# Patient Record
Sex: Female | Born: 1965
Health system: Southern US, Community
[De-identification: ages and names within clinical notes are randomized; demographics above are authoritative.]

## PROBLEM LIST (undated history)

## (undated) DIAGNOSIS — M1990 Unspecified osteoarthritis, unspecified site: Secondary | ICD-10-CM

## (undated) DIAGNOSIS — F419 Anxiety disorder, unspecified: Secondary | ICD-10-CM

## (undated) HISTORY — PX: TUBAL LIGATION: SHX77

## (undated) HISTORY — PX: OTHER SURGICAL HISTORY: SHX169

## (undated) HISTORY — DX: Unspecified osteoarthritis, unspecified site: M19.90

## (undated) HISTORY — DX: Anxiety disorder, unspecified: F41.9

## (undated) HISTORY — PX: BRAIN SURGERY: SHX531

---

## 1997-11-15 ENCOUNTER — Inpatient Hospital Stay (HOSPITAL_COMMUNITY): Admission: AD | Admit: 1997-11-15 | Discharge: 1997-11-16 | Payer: Self-pay | Admitting: Obstetrics and Gynecology

## 1997-11-19 ENCOUNTER — Inpatient Hospital Stay (HOSPITAL_COMMUNITY): Admission: AD | Admit: 1997-11-19 | Discharge: 1997-11-19 | Payer: Self-pay | Admitting: Obstetrics and Gynecology

## 1997-12-17 ENCOUNTER — Encounter (HOSPITAL_COMMUNITY): Admission: RE | Admit: 1997-12-17 | Discharge: 1998-03-17 | Payer: Self-pay | Admitting: Obstetrics and Gynecology

## 1997-12-21 ENCOUNTER — Other Ambulatory Visit: Admission: RE | Admit: 1997-12-21 | Discharge: 1997-12-21 | Payer: Self-pay | Admitting: Obstetrics and Gynecology

## 2015-12-17 ENCOUNTER — Ambulatory Visit (HOSPITAL_COMMUNITY)
Admission: EM | Admit: 2015-12-17 | Discharge: 2015-12-17 | Disposition: A | Discharge status: Home / Self Care | Payer: BLUE CROSS/BLUE SHIELD | Attending: Family Medicine | Admitting: Family Medicine

## 2015-12-17 ENCOUNTER — Encounter (HOSPITAL_COMMUNITY): Payer: Self-pay | Admitting: Emergency Medicine

## 2015-12-17 DIAGNOSIS — L241 Irritant contact dermatitis due to oils and greases: Secondary | ICD-10-CM

## 2015-12-17 MED ORDER — TRIAMCINOLONE ACETONIDE 40 MG/ML IJ SUSP
40.0000 mg | Freq: Once | INTRAMUSCULAR | Status: AC
  Administered 2015-12-17: 40 mg via INTRAMUSCULAR

## 2015-12-17 MED ORDER — PREDNISONE 50 MG PO TABS: ORAL_TABLET | ORAL | Status: DC

## 2015-12-17 MED ORDER — METHYLPREDNISOLONE ACETATE 40 MG/ML IJ SUSP
80.0000 mg | Freq: Once | INTRAMUSCULAR | Status: AC
  Administered 2015-12-17: 80 mg via INTRAMUSCULAR

## 2015-12-17 MED ORDER — HYDROXYZINE HCL 25 MG PO TABS: 25.0000 mg | ORAL_TABLET | Freq: Four times a day (QID) | ORAL | Status: DC

## 2015-12-17 MED ORDER — TRIAMCINOLONE ACETONIDE 40 MG/ML IJ SUSP
INTRAMUSCULAR | Status: AC
  Filled 2015-12-17: qty 1

## 2015-12-17 MED ORDER — METHYLPREDNISOLONE ACETATE 80 MG/ML IJ SUSP
INTRAMUSCULAR | Status: AC
  Filled 2015-12-17: qty 1

## 2015-12-17 NOTE — ED Notes (Signed)
Patient reports 4-5 days ago had a lot of itching, itching so badly it wakes her at night.  Patient denies any new soaps, detergents.

## 2015-12-17 NOTE — ED Provider Notes (Signed)
CSN: [id number]     Arrival date & time 12/17/15  1301 History   First MD Initiated Contact with Patient 12/17/15 1323     Chief Complaint  Patient presents with  . Rash   (Consider location/radiation/quality/duration/timing/severity/associated sxs/prior Treatment) Patient is a 50 y.o. female presenting with rash. The history is provided by the patient.  Rash Location:  Full body Quality: dryness and itchiness   Severity:  Moderate Onset quality:  Gradual Duration:  4 days Progression:  Unchanged Chronicity:  New Context comment:  Onset after going to tanning bed. Relieved by:  None tried Associated symptoms: fever   Associated symptoms: no diarrhea, no sore throat and not wheezing     History reviewed. No pertinent past medical history. History reviewed. No pertinent past surgical history. No family history on file. Social History  Substance Use Topics  . Smoking status: Never Smoker   . Smokeless tobacco: None  . Alcohol Use: No   OB History    No data available     Review of Systems  Constitutional: Positive for fever.  HENT: Negative.  Negative for sore throat.   Respiratory: Negative.  Negative for wheezing.   Cardiovascular: Negative.   Gastrointestinal: Negative for diarrhea.  Skin: Positive for rash. Negative for color change and pallor.  All other systems reviewed and are negative.   Allergies  Review of patient's allergies indicates no known allergies.  Home Medications   Prior to Admission medications   Medication Sig Start Date End Date Taking? Authorizing Provider  citalopram (CELEXA) 10 MG tablet Take 10 mg by mouth daily.   Yes Historical Provider, MD  hydrOXYzine (ATARAX/VISTARIL) 25 MG tablet Take 1 tablet (25 mg total) by mouth every 6 (six) hours. Prn itching 12/17/15   Billy Fischer, MD  predniSONE (DELTASONE) 50 MG tablet 1 tab daily for 2 days then 1/2 tab daily for 2 days. Start on sat, take until finished. 12/17/15   Billy Fischer, MD   Meds  Ordered and Administered this Visit   Medications  methylPREDNISolone acetate (DEPO-MEDROL) injection 80 mg (80 mg Intramuscular Given 12/17/15 1345)  triamcinolone acetonide (KENALOG-40) injection 40 mg (40 mg Intramuscular Given 12/17/15 1345)    BP 137/82 mmHg  Pulse 76  Temp(Src) 97.9 F (36.6 C) (Oral)  Resp 16  SpO2 100%  LMP 12/07/2015 No data found.   Physical Exam  Constitutional: She is oriented to person, place, and time. She appears well-developed and well-nourished. No distress.  Neck: Normal range of motion. Neck supple.  Cardiovascular: Normal rate.   Pulmonary/Chest: Effort normal and breath sounds normal. No respiratory distress.  Neurological: She is alert and oriented to person, place, and time.  Skin: Skin is warm and dry. Rash noted.  Excoriated ecchymosis from pruritis of skin, no distinct lesions.  Nursing note and vitals reviewed.   ED Course  Procedures (including critical care time)  Labs Review Labs Reviewed - No data to display  Imaging Review No results found.   Visual Acuity Review  Right Eye Distance:   Left Eye Distance:   Bilateral Distance:    Right Eye Near:   Left Eye Near:    Bilateral Near:         MDM   1. Contact dermatitis due to oils        Billy Fischer, MD 12/21/15 2051

## 2016-01-24 DIAGNOSIS — R229 Localized swelling, mass and lump, unspecified: Secondary | ICD-10-CM | POA: Insufficient documentation

## 2016-01-24 DIAGNOSIS — H9311 Tinnitus, right ear: Secondary | ICD-10-CM | POA: Insufficient documentation

## 2016-05-24 ENCOUNTER — Other Ambulatory Visit: Payer: Self-pay | Admitting: Occupational Medicine

## 2016-05-24 ENCOUNTER — Ambulatory Visit (HOSPITAL_COMMUNITY)
Admission: RE | Admit: 2016-05-24 | Discharge: 2016-05-24 | Disposition: A | Discharge status: Home / Self Care | Payer: BLUE CROSS/BLUE SHIELD | Source: Ambulatory Visit | Attending: Occupational Medicine | Admitting: Occupational Medicine

## 2016-05-24 DIAGNOSIS — R7612 Nonspecific reaction to cell mediated immunity measurement of gamma interferon antigen response without active tuberculosis: Secondary | ICD-10-CM

## 2017-02-23 ENCOUNTER — Encounter: Payer: Self-pay | Admitting: Family Medicine

## 2017-02-23 ENCOUNTER — Ambulatory Visit (INDEPENDENT_AMBULATORY_CARE_PROVIDER_SITE_OTHER): Payer: BLUE CROSS/BLUE SHIELD

## 2017-02-23 ENCOUNTER — Ambulatory Visit (INDEPENDENT_AMBULATORY_CARE_PROVIDER_SITE_OTHER): Payer: BLUE CROSS/BLUE SHIELD | Admitting: Family Medicine

## 2017-02-23 VITALS — BP 102/70 | HR 78 | Temp 98.1°F | Ht 63.0 in | Wt 133.2 lb

## 2017-02-23 DIAGNOSIS — G43109 Migraine with aura, not intractable, without status migrainosus: Secondary | ICD-10-CM | POA: Diagnosis not present

## 2017-02-23 DIAGNOSIS — M25551 Pain in right hip: Secondary | ICD-10-CM | POA: Diagnosis not present

## 2017-02-23 DIAGNOSIS — N939 Abnormal uterine and vaginal bleeding, unspecified: Secondary | ICD-10-CM | POA: Diagnosis not present

## 2017-02-23 LAB — CBC
HCT: 39.9 % (ref 36.0–46.0)
HEMOGLOBIN: 13.3 g/dL (ref 12.0–15.0)
MCHC: 33.2 g/dL (ref 30.0–36.0)
MCV: 85.6 fl (ref 78.0–100.0)
Platelets: 345 10*3/uL (ref 150.0–400.0)
RBC: 4.66 Mil/uL (ref 3.87–5.11)
RDW: 13.7 % (ref 11.5–15.5)
WBC: 6.4 10*3/uL (ref 4.0–10.5)

## 2017-02-23 LAB — POCT URINE PREGNANCY: Preg Test, Ur: NEGATIVE

## 2017-02-23 LAB — TSH: TSH: 1.11 u[IU]/mL (ref 0.35–4.50)

## 2017-02-23 NOTE — Assessment & Plan Note (Signed)
Patient with over a year of abnormal uterine bleeding. She has seen a gynecologist on several occasions for this and she reports negative workup. We'll request records from them. We'll check lab work as outlined below. Urine pregnancy test was negative. Given return precautions.

## 2017-02-23 NOTE — Assessment & Plan Note (Signed)
Slight discomfort on internal range of motion right hip. We'll obtain an x-ray. Consider referral to sports medicine or orthopedics

## 2017-02-23 NOTE — Progress Notes (Signed)
Tommi Rumps, MD Phone: [phone]  Margaret Blake is a 51 y.o. female who presents today for new patient visit.  Patient notes at least over the last year she's had menstrual cycles monthly and then for about 3 months at a time she'll have another menstrual cycle a couple weeks later. She'll then go about 3 months with normal menstrual cycles. Notes her cycles are heavy and last for 6-7 days. She is a [id number]. She is status post tubal ligation. She notes no vision changes. She has seen gynecology twice in the last year for this. She reports they did an ultrasound and an exam as well as blood work and found no cause. They discussed doing an IUD though she declined this. She wanted an ablation. She has seen them this year. She is not on her menstrual cycle at this time.  She notes right hip pain that has been somewhat chronic over a number of months. Started after a dialysis machine fell on her at work and then she fell again at home while moving. Notes typically it occurs in her right hip and feels as though it is in the joint. She notes no overlying skin changes.  She reports a history of migraines with aura. Since she had her daughter about 19 years ago she has not had the pain with them though has had the aura and gets lots of spots in her vision. She reports if she does not take something for this it will be difficult for her to see. She notes the vision issues resolved within 20 minutes of taking ibuprofen. She has followed with an eye doctor on a consistent basis. She's never seen neurology. She has had no numbness or weakness. She does note some right hand tremor that other people have noticed that resolves when she looks at her right hand. Her gynecologist tried to order CT scan to evaluate all of this though her insurance did not cover it.  Active Ambulatory Problems    Diagnosis Date Noted  . Abnormal uterine bleeding 02/23/2017  . Right hip pain 02/23/2017  . Migraine with aura and  without status migrainosus, not intractable 02/23/2017   Resolved Ambulatory Problems    Diagnosis Date Noted  . No Resolved Ambulatory Problems   Past Medical History:  Diagnosis Date  . Anxiety     Family History  Problem Relation Age of Onset  . Diabetes Father     Social History   Social History  . Marital status: Married    Spouse name: N/A  . Number of children: N/A  . Years of education: N/A   Occupational History  . Not on file.   Social History Main Topics  . Smoking status: Former Research scientist (life sciences)  . Smokeless tobacco: Never Used  . Alcohol use Yes     Comment: socially  . Drug use: No  . Sexual activity: Not on file   Other Topics Concern  . Not on file   Social History Narrative  . No narrative on file    ROS  General:  Negative for nexplained weight loss, fever Skin: Negative for new or changing mole, sore that won't heal HEENT: Negative for trouble hearing, trouble seeing, ringing in ears, mouth sores, hoarseness, change in voice, dysphagia. CV:  Negative for chest pain, dyspnea, edema, palpitations Resp: Negative for cough, dyspnea, hemoptysis GI: Negative for nausea, vomiting, diarrhea, constipation, abdominal pain, melena, hematochezia. GU: Negative for dysuria, incontinence, urinary hesitance, hematuria, vaginal or penile discharge, polyuria, sexual difficulty,  lumps in testicle or breasts MSK: Negative for muscle cramps or aches, joint pain or swelling Neuro: Negative for headaches, weakness, numbness, dizziness, passing out/fainting Psych: Negative for depression, anxiety, memory problems  Objective  Physical Exam Vitals:   02/23/17 1044  BP: 102/70  Pulse: 78  Temp: 98.1 F (36.7 C)  SpO2: 98%    BP Readings from Last 3 Encounters:  02/23/17 102/70  12/17/15 137/82   Wt Readings from Last 3 Encounters:  02/23/17 133 lb 3.2 oz (60.4 kg)    Physical Exam  Constitutional: No distress.  HENT:  Head: Normocephalic and atraumatic.    Mouth/Throat: Oropharynx is clear and moist. No oropharyngeal exudate.  Eyes: Pupils are equal, round, and reactive to light. Conjunctivae are normal.  Cardiovascular: Normal rate, regular rhythm and normal heart sounds.   Pulmonary/Chest: Effort normal and breath sounds normal.  Abdominal: Soft. Bowel sounds are normal. She exhibits no distension. There is no tenderness. There is no rebound and no guarding.  Musculoskeletal:  Full range of motion bilateral hips with slight discomfort on passive internal rotation of the right hip, no tenderness of anterior or lateral hips bilaterally  Neurological: She is alert.  CN 2-12 intact, 5/5 strength in bilateral biceps, triceps, grip, quads, hamstrings, plantar and dorsiflexion, sensation to light touch intact in bilateral UE and LE, normal gait  Skin: Skin is warm and dry. She is not diaphoretic.  Psychiatric: Mood and affect normal.     Assessment/Plan:   Migraine with aura and without status migrainosus, not intractable Patient with history of chronic intermittent migraines. More recently sounds like she has been having ocular migraines. Neurologically intact today. She does report some tremor. Given tremor and ocular migraine we will have her see neurology for evaluation. She is given return precautions.  Abnormal uterine bleeding Patient with over a year of abnormal uterine bleeding. She has seen a gynecologist on several occasions for this and she reports negative workup. We'll request records from them. We'll check lab work as outlined below. Urine pregnancy test was negative. Given return precautions.  Right hip pain Slight discomfort on internal range of motion right hip. We'll obtain an x-ray. Consider referral to sports medicine or orthopedics   Orders Placed This Encounter  Procedures  . DG HIP UNILAT WITH PELVIS 2-3 VIEWS RIGHT    Standing Status:   Future    Number of Occurrences:   1    Standing Expiration Date:   04/25/2018     Order Specific Question:   Reason for Exam (SYMPTOM  OR DIAGNOSIS REQUIRED)    Answer:   right hip pain chronically    Order Specific Question:   Is patient pregnant?    Answer:   No    Order Specific Question:   Preferred imaging location?    Answer:   [facility] Specific Question:   Radiology Contrast Protocol - do NOT remove file path    Answer:   [email or web address]  . Prolactin  . TSH  . CBC  . Ambulatory referral to Neurology    Referral Priority:   Routine    Referral Type:   Consultation    Referral Reason:   Specialty Services Required    Requested Specialty:   Neurology    Number of Visits Requested:   1  . POCT urine pregnancy    No orders of the defined types were placed in this encounter.    Tommi Rumps, MD [facility]  [facility] [employer]

## 2017-02-23 NOTE — Patient Instructions (Signed)
Nice to see you. We'll request records from your gynecologist. Margaret Blake get an x-ray of your right hip today and call you with the results. We'll get you to see neurology. We'll get lab work and call with results as well. If you develop numbness, vision changes, weakness, excessive vaginal bleeding, or any new or changing symptoms please seek medical attention.

## 2017-02-23 NOTE — Assessment & Plan Note (Signed)
Patient with history of chronic intermittent migraines. More recently sounds like she has been having ocular migraines. Neurologically intact today. She does report some tremor. Given tremor and ocular migraine we will have her see neurology for evaluation. She is given return precautions.

## 2017-02-24 LAB — PROLACTIN: PROLACTIN: 7.8 ng/mL

## 2017-02-25 ENCOUNTER — Other Ambulatory Visit: Payer: Self-pay | Admitting: Family Medicine

## 2017-02-25 DIAGNOSIS — N939 Abnormal uterine and vaginal bleeding, unspecified: Secondary | ICD-10-CM

## 2017-02-27 ENCOUNTER — Other Ambulatory Visit: Payer: Self-pay | Admitting: *Deleted

## 2017-02-27 NOTE — Telephone Encounter (Signed)
Last OV 02/23/17 filed under histoircal

## 2017-02-27 NOTE — Telephone Encounter (Signed)
Pt has requested a medication refill for citalopram  Pharmacy [facility] [address]

## 2017-02-28 NOTE — Telephone Encounter (Signed)
Please confirm dosage and reason for taking this medication. We did not discuss this medication at her visit so I want to make sure of the dosage prior to prescribing.

## 2017-03-01 NOTE — Telephone Encounter (Signed)
Patient states 20 mg daily and takes this for anxiety [facility] [address]

## 2017-03-02 MED ORDER — CITALOPRAM HYDROBROMIDE 20 MG PO TABS: 20.0000 mg | ORAL_TABLET | Freq: Every day | ORAL | 5 refills | Status: DC

## 2017-03-06 ENCOUNTER — Ambulatory Visit (INDEPENDENT_AMBULATORY_CARE_PROVIDER_SITE_OTHER): Payer: BLUE CROSS/BLUE SHIELD | Admitting: Obstetrics and Gynecology

## 2017-03-06 ENCOUNTER — Encounter: Payer: Self-pay | Admitting: Obstetrics and Gynecology

## 2017-03-06 VITALS — BP 124/78 | Ht 63.0 in | Wt 134.0 lb

## 2017-03-06 DIAGNOSIS — N939 Abnormal uterine and vaginal bleeding, unspecified: Secondary | ICD-10-CM | POA: Diagnosis not present

## 2017-03-06 NOTE — Progress Notes (Signed)
Chief Complaint  Patient presents with  . Menstrual Problem    HPI:      Ms. Margaret Blake is a 51 y.o. [id number] who LMP was Patient's last menstrual period was 02/23/2017., presents today for NP menstrual problem, referred by PCP Caryl Bis, Betsie Adam, MD). She has a hx of DUB sx for the past 2-3 yrs. Menses are usually monthly, lasting 6-7 days with med flow, large clots. Pt has occas dysmen and treats with ibup with sx relief. She occas has midcycle bleeding that lasts 5-7 days, lighter flow than a period, no clots or dysmen. Pt feels like sx are triggered by stress. She saw GYN MD in [place] last yr and had a neg u/s and neg labs, neg pap. She was told about IUD and ablation, but never proceeded. She is not interested in IUD but wants ablation. Her PCP rechecked labs, including thyroid, 8/18 and they were neg. He referred pt here for mgmt.  She is sex active, no dyspareunia, postcoital bleeding.  She is current on pap smear/annual.    Past Medical History:  Diagnosis Date  . Anxiety     Past Surgical History:  Procedure Laterality Date  . TUBAL LIGATION      Family History  Problem Relation Age of Onset  . Diabetes Father     Social History   Social History  . Marital status: Married    Spouse name: N/A  . Number of children: N/A  . Years of education: N/A   Occupational History  . Not on file.   Social History Main Topics  . Smoking status: Former Research scientist (life sciences)  . Smokeless tobacco: Never Used  . Alcohol use Yes     Comment: socially  . Drug use: No  . Sexual activity: Yes    Birth control/ protection: None   Other Topics Concern  . Not on file   Social History Narrative  . No narrative on file     Current Outpatient Prescriptions:  .  citalopram (CELEXA) 20 MG tablet, Take 1 tablet (20 mg total) by mouth daily., Disp: 30 tablet, Rfl: 5   ROS:  Review of Systems  Constitutional: Negative for fever.  Gastrointestinal: Negative for blood in stool,  constipation, diarrhea, nausea and vomiting.  Genitourinary: Positive for menstrual problem. Negative for dyspareunia, dysuria, flank pain, frequency, hematuria, urgency, vaginal bleeding, vaginal discharge and vaginal pain.  Musculoskeletal: Negative for back pain.  Skin: Negative for rash.     OBJECTIVE:   Vitals:  BP 124/78   Ht 5\' 3"  (1.6 m)   Wt 134 lb (60.8 kg)   LMP 02/23/2017   BMI 23.74 kg/m   Physical Exam  Constitutional: She is oriented to person, place, and time and well-developed, well-nourished, and in no distress. Vital signs are normal.  Genitourinary: Vagina normal, uterus normal, cervix normal, right adnexa normal, left adnexa normal and vulva normal. Uterus is not enlarged. Cervix exhibits no motion tenderness and no tenderness. Right adnexum displays no mass and no tenderness. Left adnexum displays no mass and no tenderness. Vulva exhibits no erythema, no exudate, no lesion, no rash and no tenderness. Vagina exhibits no lesion.  Neurological: She is alert and oriented to person, place, and time.  Psychiatric: Memory, affect and judgment normal.  Vitals reviewed.   Assessment/Plan: Abnormal uterine bleeding (AUB) - For 2-3 yrs, episodic mid-cycle bleeding. Discussed options of doing nothing, IUD, prog only OCPs, ablation, if u/s still neg (had neg labs 8/18).  - Plan:  US Transvaginal Non-OB Will call pt with u/s results and discuss options at that time. Discussed IUD and ablation procedures. Pt wants IUD now, if a candidate after u/s. Mirena handout given. If fails IUD, then will consider ablation.     Return in about 2 days (around 03/08/2017) for GYN u/s for DUB--[facility] to call pt.  Yosgar Demirjian B. Kanoa Phillippi, PA-C 03/06/2017 10:51 AM

## 2017-03-08 ENCOUNTER — Ambulatory Visit (INDEPENDENT_AMBULATORY_CARE_PROVIDER_SITE_OTHER): Payer: BLUE CROSS/BLUE SHIELD

## 2017-03-08 DIAGNOSIS — N939 Abnormal uterine and vaginal bleeding, unspecified: Secondary | ICD-10-CM | POA: Diagnosis not present

## 2017-03-13 ENCOUNTER — Telehealth: Payer: Self-pay | Admitting: Obstetrics and Gynecology

## 2017-03-13 NOTE — Telephone Encounter (Signed)
Pt aware of neg u/s. EM=6.98mm. Pt interested in IUD for DUB sx. Will do EMB with IUD insertion. Pt to call with next menses for insertion.

## 2017-04-19 ENCOUNTER — Ambulatory Visit (INDEPENDENT_AMBULATORY_CARE_PROVIDER_SITE_OTHER): Payer: BLUE CROSS/BLUE SHIELD | Admitting: Neurology

## 2017-04-19 ENCOUNTER — Encounter: Payer: Self-pay | Admitting: Neurology

## 2017-04-19 ENCOUNTER — Encounter (INDEPENDENT_AMBULATORY_CARE_PROVIDER_SITE_OTHER): Payer: Self-pay

## 2017-04-19 VITALS — BP 113/73 | HR 89 | Ht 63.0 in | Wt 132.0 lb

## 2017-04-19 DIAGNOSIS — G43109 Migraine with aura, not intractable, without status migrainosus: Secondary | ICD-10-CM | POA: Diagnosis not present

## 2017-04-19 DIAGNOSIS — R251 Tremor, unspecified: Secondary | ICD-10-CM | POA: Insufficient documentation

## 2017-04-19 HISTORY — DX: Tremor, unspecified: R25.1

## 2017-04-19 NOTE — Progress Notes (Signed)
PATIENT: Margaret Blake DOB: January 05, 1966  Chief Complaint  Patient presents with  . New Patient (Initial Visit)    migraines- fluctuate in frequency, maybe 1-2 in a month, depends on stress level  . Tremors    pt does not notice tremors, but friends and family notice tremor in right hand  . PCP/Referring    Dr. Caryl Bis. vision: 20/30 bilaterally with correction     HISTORICAL  Margaret Blake is a 51 year old female, seen in refer by her primary care doctor  Tommi Rumps for evaluation of right hand tremor, migraine headaches, initial evaluation was April 19 2017.  She had a history of migraine headaches since young, used to have typical migraines, lateralized severe headache with associated light noise sensitivity, for a while her headache almost disappeared, but since 2015, she began to have aura without migraine, spots light in her visual field, spreading to become visual field deficit, lasting 20 minutes after taking ibuprofen, it does not has associated headaches.  She also noticed intermittent right hand tremor since 2016, noticed it when she holding out her arm, or hold still, she denies left hand involvement.  She denied loss sense of smell, constant right ear ringing sound, no hearing loss, no visual loss.  REVIEW OF SYSTEMS: Full 14 system review of systems performed and notable only for Fatigue, ringing ears, loss of vision, memory loss, tremor  ALLERGIES: No Known Allergies  HOME MEDICATIONS: Current Outpatient Prescriptions  Medication Sig Dispense Refill  . citalopram (CELEXA) 20 MG tablet Take 1 tablet (20 mg total) by mouth daily. 30 tablet 5   No current facility-administered medications for this visit.     PAST MEDICAL HISTORY: Past Medical History:  Diagnosis Date  . Anxiety     PAST SURGICAL HISTORY: Past Surgical History:  Procedure Laterality Date  . TUBAL LIGATION      FAMILY HISTORY: Family History  Problem Relation Age of  Onset  . Diabetes Father     SOCIAL HISTORY:  Social History   Social History  . Marital status: Married    Spouse name: N/A  . Number of children: N/A  . Years of education: N/A   Occupational History  . Not on file.   Social History Main Topics  . Smoking status: Former Research scientist (life sciences)  . Smokeless tobacco: Never Used  . Alcohol use Yes     Comment: socially  . Drug use: No  . Sexual activity: Yes    Birth control/ protection: None   Other Topics Concern  . Not on file   Social History Narrative  . No narrative on file     PHYSICAL EXAM   Vitals:   04/19/17 0817  BP: 113/73  Pulse: 89  Weight: 132 lb (59.9 kg)  Height: 5\' 3"  (1.6 m)    Not recorded      Body mass index is 23.38 kg/m.  PHYSICAL EXAMNIATION:  Gen: NAD, conversant, well nourised, obese, well groomed                     Cardiovascular: Regular rate rhythm, no peripheral edema, warm, nontender. Eyes: Conjunctivae clear without exudates or hemorrhage Neck: Supple, no carotid bruits. Pulmonary: Clear to auscultation bilaterally   NEUROLOGICAL EXAM:  MENTAL STATUS: Speech:    Speech is normal; fluent and spontaneous with normal comprehension.  Cognition:     Orientation to time, place and person     Normal recent and remote memory  Normal Attention span and concentration     Normal Language, naming, repeating,spontaneous speech     Fund of knowledge   CRANIAL NERVES: CN II: Visual fields are full to confrontation. Fundoscopic exam is normal with sharp discs and no vascular changes. Pupils are round equal and briskly reactive to light. CN III, IV, VI: extraocular movement are normal. No ptosis. CN V: Facial sensation is intact to pinprick in all 3 divisions bilaterally. Corneal responses are intact.  CN VII: Face is symmetric with normal eye closure and smile. CN VIII: Hearing is normal to rubbing fingers CN IX, X: Palate elevates symmetrically. Phonation is normal. CN XI: Head turning  and shoulder shrug are intact CN XII: Tongue is midline with normal movements and no atrophy.  MOTOR: There is no pronator drift of out-stretched arms. Muscle bulk and tone are normal. Muscle strength is normal.  REFLEXES: Reflexes are 2+ and symmetric at the biceps, triceps, knees, and ankles. Plantar responses are flexor.  SENSORY: Intact to light touch, pinprick, positional sensation and vibratory sensation are intact in fingers and toes.  COORDINATION: Rapid alternating movements and fine finger movements are intact. There is no dysmetria on finger-to-nose and heel-knee-shin.    GAIT/STANCE: Posture is normal. Gait is steady with normal steps, base, arm swing, and turning. Heel and toe walking are normal. Tandem gait is normal.  Romberg is absent.   DIAGNOSTIC DATA (LABS, IMAGING, TESTING) - I reviewed patient records, labs, notes, testing and imaging myself where available.   ASSESSMENT AND PLAN  Margaret Blake is a 51 y.o. female   Migraine aura Right hand tremor  Normal TSH, CBC,  Continue NSAIDs as needed  Differentiation for her tremor include asymmetric essential tremor, exaggerated physiological tremor, I did not see any signs of parkinsonian on today's exam  Follow-up with our clinic as needed   Marcial Pacas, M.D. Ph.D.  [facility] [address] [place], [place] [address] Ph: [phone] Fax: [phone]  CC: Leone Haven, MD

## 2017-05-12 ENCOUNTER — Telehealth: Payer: Self-pay | Admitting: Family Medicine

## 2017-05-12 DIAGNOSIS — N6001 Solitary cyst of right breast: Secondary | ICD-10-CM

## 2017-05-12 DIAGNOSIS — N6002 Solitary cyst of left breast: Principal | ICD-10-CM

## 2017-05-12 NOTE — Telephone Encounter (Signed)
Reviewed records from prior gynecologist. It appears that they recommended follow-up bilateral breast ultrasound on her most recent mammogram. This will be scanned into the chart. It does not appear this was completed based on the records we received. Please see if she had this done. Thanks.

## 2017-05-14 NOTE — Telephone Encounter (Signed)
Order has been placed.

## 2017-05-14 NOTE — Telephone Encounter (Signed)
Patient states she did not have this done, patient states she would like for [place] to set this up if possible

## 2017-05-14 NOTE — Addendum Note (Signed)
Addended by: Leone Haven on: 05/14/2017 09:57 AM   Modules accepted: Orders

## 2017-05-17 ENCOUNTER — Other Ambulatory Visit: Payer: Self-pay | Admitting: Family Medicine

## 2017-05-17 DIAGNOSIS — R928 Other abnormal and inconclusive findings on diagnostic imaging of breast: Secondary | ICD-10-CM

## 2017-05-22 ENCOUNTER — Telehealth: Payer: Self-pay | Admitting: Family Medicine

## 2017-05-22 NOTE — Telephone Encounter (Signed)
Pt would like to wait for mammo until next year per pt.

## 2017-05-28 ENCOUNTER — Other Ambulatory Visit: Payer: Self-pay

## 2017-05-28 MED ORDER — CITALOPRAM HYDROBROMIDE 20 MG PO TABS: 20.0000 mg | ORAL_TABLET | Freq: Every day | ORAL | 0 refills | Status: DC

## 2017-06-14 ENCOUNTER — Ambulatory Visit: Payer: BLUE CROSS/BLUE SHIELD | Admitting: Family Medicine

## 2017-08-23 ENCOUNTER — Ambulatory Visit: Payer: BLUE CROSS/BLUE SHIELD | Admitting: Family Medicine

## 2017-08-24 ENCOUNTER — Other Ambulatory Visit: Payer: Self-pay | Admitting: Family Medicine

## 2017-08-28 NOTE — Telephone Encounter (Signed)
Sent to pharmacy.  She needs to follow-up for consideration of further refills.  Thanks.

## 2017-08-28 NOTE — Telephone Encounter (Signed)
Okay to refill Celexa?  Pt last seen in August 2018 and cancelled her last 2 appt including 08/23/17 appt.

## 2017-08-29 ENCOUNTER — Encounter: Payer: Self-pay | Admitting: *Deleted

## 2017-08-29 NOTE — Telephone Encounter (Signed)
Mailed letter °

## 2017-10-18 ENCOUNTER — Other Ambulatory Visit: Payer: Self-pay | Admitting: Family Medicine

## 2018-01-26 ENCOUNTER — Other Ambulatory Visit: Payer: Self-pay | Admitting: Family Medicine

## 2018-04-16 ENCOUNTER — Ambulatory Visit: Payer: BLUE CROSS/BLUE SHIELD | Admitting: Family Medicine

## 2018-04-16 DIAGNOSIS — Z0289 Encounter for other administrative examinations: Secondary | ICD-10-CM

## 2018-07-25 ENCOUNTER — Telehealth: Payer: Self-pay | Admitting: Family Medicine

## 2018-07-25 DIAGNOSIS — R928 Other abnormal and inconclusive findings on diagnostic imaging of breast: Secondary | ICD-10-CM

## 2018-07-25 NOTE — Telephone Encounter (Signed)
Called and spoke with patient. Pt advised about MM. Pt stated that she is having hot flashes and memory loss, and has been without a period for 7 months. I did advised pt that she has to not have a period for a full year until she is consisted to be in menopause.

## 2018-07-25 NOTE — Telephone Encounter (Signed)
Noted. I will place orders. Please find out why she thinks she has hit menopause.

## 2018-07-25 NOTE — Telephone Encounter (Signed)
Called and spoke with patient. Pt stated that she does plan to follow up with [facility]. She would like the orders placed for MM. Pt has scheduled for an OV with you in March. 10/01/2018. Pt stated that she does believes that she has hit menopause. FYI

## 2018-07-25 NOTE — Telephone Encounter (Signed)
Please call the patient and let her know I received a reminder that she was supposed to have had a mammogram completed previously.  It has been sometime since I have seen her in the office.  If she is going to continue to follow with [place] I can place the order for the mammogram and we can get that scheduled.  If she is not following with [place] and she has a new PCP she needs to contact them to have this completed.

## 2018-07-25 NOTE — Addendum Note (Signed)
Addended by: Leone Haven on: 07/25/2018 04:08 PM   Modules accepted: Orders

## 2018-07-26 NOTE — Telephone Encounter (Signed)
Noted.  She will need to let the mammogram staff know this so they can determine if she needs a pregnancy test prior to the mammogram.

## 2018-07-29 ENCOUNTER — Telehealth: Payer: Self-pay

## 2018-07-29 NOTE — Telephone Encounter (Signed)
Copied from [place] [phone]. Topic: Quick Communication - See Telephone Encounter >> Jul 29, 2018  1:05 PM Rayann Heman wrote: CRM for notification. See Telephone encounter for: 07/29/18. Pt called and stated that Margaret Blake tried to call her and would like a call back. Please advise

## 2018-07-29 NOTE — Telephone Encounter (Signed)
Called patient and left a detailed VM. Advised patient to call back if they have any questions.

## 2018-07-29 NOTE — Telephone Encounter (Signed)
Called and spoke with patient to relay message from Dr. Caryl Bis about the mammogram. Pt advised and voiced understanding.

## 2018-08-25 ENCOUNTER — Other Ambulatory Visit: Payer: Self-pay | Admitting: Family Medicine

## 2018-08-26 NOTE — Telephone Encounter (Signed)
Last OV 02/23/2017  Pt is due for an OV call pt.

## 2018-08-26 NOTE — Telephone Encounter (Signed)
Pt has scheduled an OV for 10/01/2018   Pt will need this refilled before her appt only has two weeks worth left and pt really can be without this medication   Sent to PCP for approval

## 2018-08-27 NOTE — Telephone Encounter (Signed)
Refill sent to pharmacy.  Patient should keep her appointment.

## 2018-09-12 ENCOUNTER — Telehealth: Payer: Self-pay

## 2018-09-12 DIAGNOSIS — N6321 Unspecified lump in the left breast, upper outer quadrant: Secondary | ICD-10-CM | POA: Diagnosis not present

## 2018-09-12 DIAGNOSIS — N6009 Solitary cyst of unspecified breast: Secondary | ICD-10-CM | POA: Diagnosis not present

## 2018-09-12 LAB — HM MAMMOGRAPHY: HM Mammogram: ABNORMAL — AB (ref 0–4)

## 2018-09-12 NOTE — Telephone Encounter (Signed)
From what I can tell in the chart the diagnostic mammogram and bilateral US orders placed due to history of abnormal mammograms  She has not been seen here in the office since 2018  I am not comfortable changing or overriding Dr Ellen Henri orders as I have never seen the patient and she has not been seen here recently

## 2018-09-12 NOTE — Telephone Encounter (Signed)
Received phone call transferred from [facility].  Janett Billow from [employer] stating pt was in their office for an appt today.  Janett Billow said that a diagnostic mammogram was ordered but pt wants to have a screening done since insurance will only cover screening.  Janett Billow wanted an approval to do a screening mammogram instead.  Informed Janett Billow that Dr. Biagio Quint who ordered the mammogram is not in the office this week and that I will need to get the FNP covering for him to call her back but she was currently with a patient.  Janett Billow asked that the call be expedited since pt was already in the room waiting for a decision.  Jessica left call back number for FNP [phone]. Will forward notes to Philis Nettle, FNP covering for Dr. Caryl Bis today.

## 2018-09-12 NOTE — Telephone Encounter (Signed)
Called Margaret Blake back at [facility].  Left vm informing her that pt has not been seen in this office since 2018 and Ms. Chauncy Passy, FNP covering for Dr. Caryl Bis today does not feel comfortable changing his orders per note received from [address], [place].  Left office number for call back if Margaret Blake has any questions.

## 2018-09-24 DIAGNOSIS — N6321 Unspecified lump in the left breast, upper outer quadrant: Secondary | ICD-10-CM | POA: Diagnosis not present

## 2018-09-24 DIAGNOSIS — N6325 Unspecified lump in the left breast, overlapping quadrants: Secondary | ICD-10-CM | POA: Diagnosis not present

## 2018-09-24 LAB — HM MAMMOGRAPHY

## 2018-10-01 ENCOUNTER — Other Ambulatory Visit: Payer: Self-pay

## 2018-10-01 ENCOUNTER — Encounter: Payer: Self-pay | Admitting: Family Medicine

## 2018-10-01 ENCOUNTER — Ambulatory Visit: Payer: Self-pay | Admitting: Family Medicine

## 2018-10-01 ENCOUNTER — Ambulatory Visit (INDEPENDENT_AMBULATORY_CARE_PROVIDER_SITE_OTHER): Payer: Commercial Managed Care - PPO | Admitting: Family Medicine

## 2018-10-01 VITALS — BP 110/60 | HR 68 | Temp 98.6°F | Ht 63.0 in | Wt 146.6 lb

## 2018-10-01 DIAGNOSIS — F419 Anxiety disorder, unspecified: Secondary | ICD-10-CM | POA: Insufficient documentation

## 2018-10-01 DIAGNOSIS — Z13220 Encounter for screening for lipoid disorders: Secondary | ICD-10-CM | POA: Diagnosis not present

## 2018-10-01 DIAGNOSIS — Z1211 Encounter for screening for malignant neoplasm of colon: Secondary | ICD-10-CM

## 2018-10-01 DIAGNOSIS — D249 Benign neoplasm of unspecified breast: Secondary | ICD-10-CM | POA: Insufficient documentation

## 2018-10-01 DIAGNOSIS — N939 Abnormal uterine and vaginal bleeding, unspecified: Secondary | ICD-10-CM

## 2018-10-01 DIAGNOSIS — G4710 Hypersomnia, unspecified: Secondary | ICD-10-CM | POA: Insufficient documentation

## 2018-10-01 DIAGNOSIS — F39 Unspecified mood [affective] disorder: Secondary | ICD-10-CM | POA: Insufficient documentation

## 2018-10-01 HISTORY — DX: Hypersomnia, unspecified: G47.10

## 2018-10-01 LAB — COMPREHENSIVE METABOLIC PANEL
ALBUMIN: 4.2 g/dL (ref 3.5–5.2)
ALT: 14 U/L (ref 0–35)
AST: 20 U/L (ref 0–37)
Alkaline Phosphatase: 77 U/L (ref 39–117)
BUN: 10 mg/dL (ref 6–23)
CALCIUM: 9 mg/dL (ref 8.4–10.5)
CHLORIDE: 100 meq/L (ref 96–112)
CO2: 33 mEq/L — ABNORMAL HIGH (ref 19–32)
Creatinine, Ser: 0.75 mg/dL (ref 0.40–1.20)
GFR: 81.37 mL/min (ref >60.00)
Glucose, Bld: 100 mg/dL — ABNORMAL HIGH (ref 70–99)
Potassium: 3.5 mEq/L (ref 3.5–5.1)
SODIUM: 139 meq/L (ref 135–145)
Total Bilirubin: 0.3 mg/dL (ref 0.2–1.2)
Total Protein: 6.6 g/dL (ref 6.0–8.3)

## 2018-10-01 LAB — LIPID PANEL
CHOLESTEROL: 220 mg/dL — AB (ref 0–200)
HDL: 73.2 mg/dL (ref >39.00)
LDL Cholesterol: 131 mg/dL — ABNORMAL HIGH (ref 0–99)
NonHDL: 146.37
TRIGLYCERIDES: 79 mg/dL (ref 0.0–149.0)
Total CHOL/HDL Ratio: 3
VLDL: 15.8 mg/dL (ref 0.0–40.0)

## 2018-10-01 LAB — POCT URINE PREGNANCY: PREG TEST UR: NEGATIVE

## 2018-10-01 LAB — TSH: TSH: 0.85 u[IU]/mL (ref 0.35–4.50)

## 2018-10-01 LAB — CBC
HCT: 38.2 % (ref 36.0–46.0)
Hemoglobin: 12.9 g/dL (ref 12.0–15.0)
MCHC: 33.8 g/dL (ref 30.0–36.0)
MCV: 84.3 fl (ref 78.0–100.0)
Platelets: 310 10*3/uL (ref 150.0–400.0)
RBC: 4.53 Mil/uL (ref 3.87–5.11)
RDW: 13.8 % (ref 11.5–15.5)
WBC: 6.8 10*3/uL (ref 4.0–10.5)

## 2018-10-01 NOTE — Assessment & Plan Note (Signed)
Bleeding is improving.  She declines pelvic exam.  Will check CBC.  Will refer back to gynecology.  Given return precautions.

## 2018-10-01 NOTE — Assessment & Plan Note (Signed)
Stable.  She will continue Celexa.

## 2018-10-01 NOTE — Assessment & Plan Note (Signed)
Noted on most recent mammogram report.  Awaiting these to be scanned in.

## 2018-10-01 NOTE — Progress Notes (Signed)
Tommi Rumps, MD Phone: [phone]  Margaret Blake is a 53 y.o. female who presents today for f/u.  Abnormal uterine bleeding: Patient has a history of this previously.  She had an ultrasound and then reports that she had an endometrial biopsy though I do not see results of this.  There was some discussion about placing an IUD though she did not undergo this.  She notes over the last 9 to 10 months she has not had a menstrual cycle though 6 days ago she developed vaginal bleeding.  She notes it seems to be tapering off.  No abdominal pain.  She does note she had her tubes tied.  She notes no hot flashes.  She is unsure of when her last Pap smear was.  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  She had a fibroadenoma removed when she was young per her report.  She also had a cyst drained.  She reports benign results.  Most recent mammograms with fibroadenomas.  These results have been sent to scan.    Anxiety: Patient notes the Celexa works fairly well.  She notes no depression.  She notes minimal anxiety.  Hypersomnia: Patient reports she feels tired most of the time.  She does wake up well rested.  She feels as though she sleeps well.  She notes rare snoring.  No apneic episodes.  She goes to bed at 11 PM and wakes up at 5 AM.  Epworth sleepiness scale score of 12.  Social History   Tobacco Use  Smoking Status Former Smoker  Smokeless Tobacco Never Used     ROS see history of present illness  Objective  Physical Exam Vitals:   10/01/18 1312  BP: 110/60  Pulse: 68  Temp: 98.6 F (37 C)  SpO2: 98%    BP Readings from Last 3 Encounters:  10/01/18 110/60  04/19/17 113/73  03/06/17 124/78   Wt Readings from Last 3 Encounters:  10/01/18 146 lb 9.6 oz (66.5 kg)  04/19/17 132 lb (59.9 kg)  03/06/17 134 lb (60.8 kg)    Physical Exam Constitutional:      General: She is not in acute distress.  Appearance: She is not diaphoretic.  Cardiovascular:     Rate and Rhythm: Normal rate and regular rhythm.     Heart sounds: Normal heart sounds.  Pulmonary:     Effort: Pulmonary effort is normal.     Breath sounds: Normal breath sounds.  Genitourinary:    Comments: Patient declined pelvic exam Musculoskeletal:     Right lower leg: No edema.     Left lower leg: No edema.  Skin:    General: Skin is warm and dry.  Neurological:     Mental Status: She is alert.      Assessment/Plan: Please see individual problem list.  Abnormal uterine bleeding Bleeding is improving.  She declines pelvic exam.  Will check CBC.  Will refer back to gynecology.  Given return precautions.  Anxiety Stable.  She will continue Celexa.  Fibroadenoma Noted on most recent mammogram report.  Awaiting these to be scanned in.  Hypersomnia Discussed referral to a sleep specialist though she declined this and noted she wanted to try to get more sleep at night.  We will follow-up with her in several months and if not improving with increased sleep would consider referral to a sleep specialist for sleep study.   Orders Placed This Encounter  Procedures  . CBC  .  Comp Met (CMET)  . Lipid panel  . TSH  . Ambulatory referral to Gastroenterology    Referral Priority:   Routine    Referral Type:   Consultation    Referral Reason:   Specialty Services Required    Number of Visits Requested:   1  . Ambulatory referral to Gynecology    Referral Priority:   Routine    Referral Type:   Consultation    Referral Reason:   Specialty Services Required    Requested Specialty:   Gynecology    Number of Visits Requested:   1  . POCT urine pregnancy    No orders of the defined types were placed in this encounter.    Tommi Rumps, MD [place]

## 2018-10-01 NOTE — Assessment & Plan Note (Signed)
Discussed referral to a sleep specialist though she declined this and noted she wanted to try to get more sleep at night.  We will follow-up with her in several months and if not improving with increased sleep would consider referral to a sleep specialist for sleep study.

## 2018-10-01 NOTE — Patient Instructions (Signed)
Nice to see you. We will check lab work today and call with the results.  We will refer you back to GYN. If your bleeding does not stop in the next couple of days please let us know. If it worsens or you develop any new symptoms such as shortness of breath, increased fatigue, or abdominal pain please be evaluated.  I have referred you to GI as well.

## 2018-10-08 ENCOUNTER — Other Ambulatory Visit: Payer: Self-pay

## 2018-10-08 ENCOUNTER — Telehealth: Payer: Self-pay | Admitting: Gastroenterology

## 2018-10-08 ENCOUNTER — Telehealth: Payer: Self-pay

## 2018-10-08 ENCOUNTER — Encounter: Payer: Self-pay | Admitting: Obstetrics & Gynecology

## 2018-10-08 DIAGNOSIS — Z1211 Encounter for screening for malignant neoplasm of colon: Secondary | ICD-10-CM

## 2018-10-08 NOTE — Telephone Encounter (Signed)
Contacted patient to discuss her colonoscopy instructions for 03/04/19.  Reviewed instructions and asked her to call the office if she has any questions closer to her procedure date.  Informed her that her instruction packet will be sent to her in the mail.  Procedure ordered, letter prepare.  Thanks [employer]

## 2018-10-08 NOTE — Telephone Encounter (Signed)
Gastroenterology Pre-Procedure Review  Request Date: Tues 03/04/2019 Requesting Physician: Dr.Vanga  PATIENT REVIEW QUESTIONS: The patient responded to the following health history questions as indicated:    1. Are you having any GI issues? no 2. Do you have a personal history of Polyps? no 3. Do you have a family history of Colon Cancer or Polyps? no 4. Diabetes Mellitus? no 5. Joint replacements in the past 12 months?no 6. Major health problems in the past 3 months?no 7. Any artificial heart valves, MVP, or defibrillator?no    MEDICATIONS & ALLERGIES:    Patient reports the following regarding taking any anticoagulation/antiplatelet therapy:   Plavix, Coumadin, Eliquis, Xarelto, Lovenox, Pradaxa, Brilinta, or Effient? no Aspirin? no  Patient confirms/reports the following medications:  Current Outpatient Medications  Medication Sig Dispense Refill  . citalopram (CELEXA) 20 MG tablet TAKE 1 TABLET (20 MG TOTAL) DAILY BY MOUTH. 90 tablet 0   No current facility-administered medications for this visit.     Patient confirms/reports the following allergies:  No Known Allergies  No orders of the defined types were placed in this encounter.   AUTHORIZATION INFORMATION Primary Insurance: 1D#: Group #:  Secondary Insurance: 1D#: Group #:  SCHEDULE INFORMATION: Date: Tues 03/04/2019 Time: Location: [place]

## 2018-12-21 ENCOUNTER — Other Ambulatory Visit: Payer: Self-pay | Admitting: Family Medicine

## 2019-02-20 ENCOUNTER — Telehealth: Payer: Self-pay

## 2019-02-20 NOTE — Telephone Encounter (Signed)
Patients colonoscopy has been rescheduled from 08/25 to 03/06/19 with Dr. Marius Ditch at [facility].  Patient has been informed of her new COVID test date Monday 03/03/19.  Thanks [employer]

## 2019-03-03 ENCOUNTER — Other Ambulatory Visit: Admission: RE | Admit: 2019-03-03 | Payer: Commercial Managed Care - PPO | Source: Ambulatory Visit

## 2019-03-06 ENCOUNTER — Ambulatory Visit
Admission: RE | Admit: 2019-03-06 | Payer: Commercial Managed Care - PPO | Source: Home / Self Care | Admitting: Gastroenterology

## 2019-03-06 ENCOUNTER — Encounter: Admission: RE | Payer: Self-pay | Source: Home / Self Care

## 2019-03-06 SURGERY — COLONOSCOPY WITH PROPOFOL
Anesthesia: General

## 2019-03-16 ENCOUNTER — Other Ambulatory Visit: Payer: Self-pay | Admitting: Family Medicine

## 2019-04-13 ENCOUNTER — Other Ambulatory Visit: Payer: Self-pay | Admitting: Family Medicine

## 2019-04-29 ENCOUNTER — Telehealth: Payer: Self-pay | Admitting: *Deleted

## 2019-04-29 NOTE — Telephone Encounter (Signed)
Copied from [place] [id number]. Topic: Appointment Scheduling - Transfer of Care >> Apr 29, 2019  3:15 PM Rayann Heman wrote: Pt is requesting to transfer FROM: [place] Pt is requesting to transfer TO: Aundra Dubin  Reason for requested transfer: prefers female   Send CRM to patient's current PCP (transferring FROM).

## 2019-04-29 NOTE — Telephone Encounter (Signed)
Ok please schedule   Margaret Blake

## 2019-04-29 NOTE — Telephone Encounter (Signed)
This is fine with me   

## 2019-04-30 ENCOUNTER — Telehealth: Payer: Self-pay | Admitting: Internal Medicine

## 2019-04-30 NOTE — Telephone Encounter (Signed)
FYI- While speaking with pt to schedule appt from Dr Caryl Bis to Dr Aundra Dubin, pt mentioned she is bleeding bright red blood from her rectum. Pt states its probably hemorrhoids. I advised if your bleeding from your rectum you may need to go to the ER. Pt stated I know your supposed to inform me of that.

## 2019-06-17 ENCOUNTER — Ambulatory Visit (INDEPENDENT_AMBULATORY_CARE_PROVIDER_SITE_OTHER): Payer: Commercial Managed Care - PPO | Admitting: Internal Medicine

## 2019-06-17 ENCOUNTER — Encounter: Payer: Self-pay | Admitting: Internal Medicine

## 2019-06-17 VITALS — BP 124/68 | Ht 63.0 in | Wt 150.0 lb

## 2019-06-17 DIAGNOSIS — K625 Hemorrhage of anus and rectum: Secondary | ICD-10-CM

## 2019-06-17 DIAGNOSIS — R635 Abnormal weight gain: Secondary | ICD-10-CM | POA: Diagnosis not present

## 2019-06-17 DIAGNOSIS — Z1389 Encounter for screening for other disorder: Secondary | ICD-10-CM

## 2019-06-17 DIAGNOSIS — R195 Other fecal abnormalities: Secondary | ICD-10-CM

## 2019-06-17 DIAGNOSIS — Z13220 Encounter for screening for lipoid disorders: Secondary | ICD-10-CM

## 2019-06-17 DIAGNOSIS — N951 Menopausal and female climacteric states: Secondary | ICD-10-CM | POA: Diagnosis not present

## 2019-06-17 DIAGNOSIS — Z1211 Encounter for screening for malignant neoplasm of colon: Secondary | ICD-10-CM

## 2019-06-17 DIAGNOSIS — E559 Vitamin D deficiency, unspecified: Secondary | ICD-10-CM

## 2019-06-17 DIAGNOSIS — Z1329 Encounter for screening for other suspected endocrine disorder: Secondary | ICD-10-CM

## 2019-06-17 DIAGNOSIS — E663 Overweight: Secondary | ICD-10-CM | POA: Diagnosis not present

## 2019-06-17 DIAGNOSIS — K649 Unspecified hemorrhoids: Secondary | ICD-10-CM | POA: Diagnosis not present

## 2019-06-17 DIAGNOSIS — Z1231 Encounter for screening mammogram for malignant neoplasm of breast: Secondary | ICD-10-CM

## 2019-06-17 DIAGNOSIS — R5383 Other fatigue: Secondary | ICD-10-CM

## 2019-06-17 DIAGNOSIS — F419 Anxiety disorder, unspecified: Secondary | ICD-10-CM

## 2019-06-17 MED ORDER — PHENTERMINE HCL 37.5 MG PO TABS: 18.2500 mg | ORAL_TABLET | Freq: Every day | ORAL | 0 refills | Status: DC

## 2019-06-17 NOTE — Progress Notes (Addendum)
Virtual Visit via Video Note  I connected with Margaret Blake  on 06/17/19 at 10:00 AM EST by a video enabled telemedicine application and verified that I am speaking with the correct person using two identifiers.  Location patient: work [employer] or home office Persons participating in the virtual visit: patient, provider  I discussed the limitations of evaluation and management by telemedicine and the availability of in person appointments. The patient expressed understanding and agreed to proceed.   HPI: 1. Anxiety celexa is helping tried effexor in the past and did not like the way it made her feel coming off  2. C/o hot flashes estroven otc has helped some and she sleeps with a fan and uses cold packs declines effexor or gabapentin or clonidine  C/o fatigue and wt gain and just started exercising yesterday she has gained to 150 lbs and up 15-20 lbs    ROS: See pertinent positives and negatives per HPI.  Past Medical History:  Diagnosis Date  . Anxiety     Past Surgical History:  Procedure Laterality Date  . TUBAL LIGATION      Family History  Problem Relation Age of Onset  . Diabetes Father     SOCIAL HX:  3 kids  Working at hospital    Current Outpatient Medications:  .  citalopram (CELEXA) 20 MG tablet, TAKE 1 TABLET (20 MG TOTAL) DAILY BY MOUTH., Disp: 90 tablet, Rfl: 0 .  Rhubarb (ESTROVEN MENOPAUSE RELIEF) 4 MG TABS, Take by mouth., Disp: , Rfl:  .  phentermine (ADIPEX-P) 37.5 MG tablet, Take 0.5 tablets (18.75 mg total) by mouth daily before breakfast., Disp: 60 tablet, Rfl: 0  EXAM:  VITALS per patient if applicable:  GENERAL: alert, oriented, appears well and in no acute distress  HEENT: atraumatic, conjunttiva clear, no obvious abnormalities on inspection of external nose and ears  NECK: normal movements of the head and neck  LUNGS: on inspection no signs of respiratory distress, breathing rate appears normal, no obvious gross SOB,  gasping or wheezing  CV: no obvious cyanosis  MS: moves all visible extremities without noticeable abnormality  PSYCH/NEURO: pleasant and cooperative, no obvious depression or anxiety, speech and thought processing grossly intact  ASSESSMENT AND PLAN:  Discussed the following assessment and plan:  Menopausal hot flushes Cold packs and fan Declines gabapentin/effexor   Overweight (BMI 25.0-29.9) - Plan: phentermine (ADIPEX-P) 37.5 MG  1/2 pill x 4 months  rec healthy diet and exercise   Weight gain - Plan: phentermine (ADIPEX-P) 37.5 MG tablet  Anxiety On celexa   Hemorrhoids, unspecified hemorrhoid type BRBPR (bright red blood per rectum) Mucus in stool -refer [facility]   HM Flu shot utd  Check Tdap at work thinks had in 2016 shingrix disc at f/u   mammo referred [facility] due 09/2019 Pap [facility] ? 2018 try to get record as of 06/23/19 no records  colonoscopy referred [facility] today severe hemorrhoids x 1 year with clear mucous leakage daily and brbpr Skin lesions on thighs hold on referral  dexa age 34 Former smoker quit in 2014 smoked x 35 years 1 ppd  D3 rec 4000 IU qd and mvt otc qd   -we discussed possible serious and likely etiologies, options for evaluation and workup, limitations of telemedicine visit vs in person visit, treatment, treatment risks and precautions. Pt prefers to treat via telemedicine empirically rather then risking or undertaking an in person visit at this moment. Patient agrees to seek prompt in person care  if worsening, new symptoms arise, or if is not improving with treatment.   I discussed the assessment and treatment plan with the patient. The patient was provided an opportunity to ask questions and all were answered. The patient agreed with the plan and demonstrated an understanding of the instructions.   The patient was advised to call back or seek an in-person evaluation if the symptoms worsen or if the condition fails to improve as  anticipated.  Time spent 25 minutes  Delorise Jackson, MD

## 2019-06-17 NOTE — Patient Instructions (Signed)
Menopause Menopause is the normal time of life when menstrual periods stop completely. It is usually confirmed by 12 months without a menstrual period. The transition to menopause (perimenopause) most often happens between the ages of 45 and 55. During perimenopause, hormone levels change in your body, which can cause symptoms and affect your health. Menopause may increase your risk for:  Loss of bone (osteoporosis), which causes bone breaks (fractures).  Depression.  Hardening and narrowing of the arteries (atherosclerosis), which can cause heart attacks and strokes. What are the causes? This condition is usually caused by a natural change in hormone levels that happens as you get older. The condition may also be caused by surgery to remove both ovaries (bilateral oophorectomy). What increases the risk? This condition is more likely to start at an earlier age if you have certain medical conditions or treatments, including:  A tumor of the pituitary gland in the brain.  A disease that affects the ovaries and hormone production.  Radiation treatment for cancer.  Certain cancer treatments, such as chemotherapy or hormone (anti-estrogen) therapy.  Heavy smoking and excessive alcohol use.  Family history of early menopause. This condition is also more likely to develop earlier in women who are very thin. What are the signs or symptoms? Symptoms of this condition include:  Hot flashes.  Irregular menstrual periods.  Night sweats.  Changes in feelings about sex. This could be a decrease in sex drive or an increased comfort around your sexuality.  Vaginal dryness and thinning of the vaginal walls. This may cause painful intercourse.  Dryness of the skin and development of wrinkles.  Headaches.  Problems sleeping (insomnia).  Mood swings or irritability.  Memory problems.  Weight gain.  Hair growth on the face and chest.  Bladder infections or problems with urinating. How  is this diagnosed? This condition is diagnosed based on your medical history, a physical exam, your age, your menstrual history, and your symptoms. Hormone tests may also be done. How is this treated? In some cases, no treatment is needed. You and your health care provider should make a decision together about whether treatment is necessary. Treatment will be based on your individual condition and preferences. Treatment for this condition focuses on managing symptoms. Treatment may include:  Menopausal hormone therapy (MHT).  Medicines to treat specific symptoms or complications.  Acupuncture.  Vitamin or herbal supplements. Before starting treatment, make sure to let your health care provider know if you have a personal or family history of:  Heart disease.  Breast cancer.  Blood clots.  Diabetes.  Osteoporosis. Follow these instructions at home: Lifestyle  Do not use any products that contain nicotine or tobacco, such as cigarettes and e-cigarettes. If you need help quitting, ask your health care provider.  Get at least 30 minutes of physical activity on 5 or more days each week.  Avoid alcoholic and caffeinated beverages, as well as spicy foods. This may help prevent hot flashes.  Get 7-8 hours of sleep each night.  If you have hot flashes, try: ? Dressing in layers. ? Avoiding things that may trigger hot flashes, such as spicy food, warm places, or stress. ? Taking slow, deep breaths when a hot flash starts. ? Keeping a fan in your home and office.  Find ways to manage stress, such as deep breathing, meditation, or journaling.  Consider going to group therapy with other women who are having menopause symptoms. Ask your health care provider about recommended group therapy meetings. Eating and   drinking  Eat a healthy, balanced diet that contains whole grains, lean protein, low-fat dairy, and plenty of fruits and vegetables.  Your health care provider may recommend  adding more soy to your diet. Foods that contain soy include tofu, tempeh, and soy milk.  Eat plenty of foods that contain calcium and vitamin D for bone health. Items that are rich in calcium include low-fat milk, yogurt, beans, almonds, sardines, broccoli, and kale. Medicines  Take over-the-counter and prescription medicines only as told by your health care provider.  Talk with your health care provider before starting any herbal supplements. If prescribed, take vitamins and supplements as told by your health care provider. These may include: ? Calcium. Women age 85 and older should get 1,200 mg (milligrams) of calcium every day. ? Vitamin D. Women need 600-800 International Units of vitamin D each day. ? Vitamins B12 and B6. Aim for 50 micrograms of B12 and 1.5 mg of B6 each day. General instructions  Keep track of your menstrual periods, including: ? When they occur. ? How heavy they are and how long they last. ? How much time passes between periods.  Keep track of your symptoms, noting when they start, how often you have them, and how long they last.  Use vaginal lubricants or moisturizers to help with vaginal dryness and improve comfort during sex.  Keep all follow-up visits as told by your health care provider. This is important. This includes any group therapy or counseling. Contact a health care provider if:  You are still having menstrual periods after age 22.  You have pain during sex.  You have not had a period for 12 months and you develop vaginal bleeding. Get help right away if:  You have: ? Severe depression. ? Excessive vaginal bleeding. ? Pain when you urinate. ? A fast or irregular heart beat (palpitations). ? Severe headaches. ? Abdomen (abdominal) pain or severe indigestion.  You fell and you think you have a broken bone.  You develop leg or chest pain.  You develop vision problems.  You feel a lump in your breast. Summary  Menopause is the normal  time of life when menstrual periods stop completely. It is usually confirmed by 12 months without a menstrual period.  The transition to menopause (perimenopause) most often happens between the ages of 1 and 69.  Symptoms can be managed through medicines, lifestyle changes, and complementary therapies such as acupuncture.  Eat a balanced diet that is rich in nutrients to promote bone health and heart health and to manage symptoms during menopause. This information is not intended to replace advice given to you by your health care provider. Make sure you discuss any questions you have with your health care provider. Document Released: 09/16/2003 Document Revised: 06/08/2017 Document Reviewed: 07/29/2016 Elsevier Patient Education  2020 [employer].  Hemorrhoids Hemorrhoids are swollen veins in and around the rectum or anus. There are two types of hemorrhoids:  Internal hemorrhoids. These occur in the veins that are just inside the rectum. They may poke through to the outside and become irritated and painful.  External hemorrhoids. These occur in the veins that are outside the anus and can be felt as a painful swelling or hard lump near the anus. Most hemorrhoids do not cause serious problems, and they can be managed with home treatments such as diet and lifestyle changes. If home treatments do not help the symptoms, procedures can be done to shrink or remove the hemorrhoids. What are the causes?  This condition is caused by increased pressure in the anal area. This pressure may result from various things, including:  Constipation.  Straining to have a bowel movement.  Diarrhea.  Pregnancy.  Obesity.  Sitting for long periods of time.  Heavy lifting or other activity that causes you to strain.  Anal sex.  Riding a bike for a long period of time. What are the signs or symptoms? Symptoms of this condition include:  Pain.  Anal itching or irritation.  Rectal bleeding.   Leakage of stool (feces).  Anal swelling.  One or more lumps around the anus. How is this diagnosed? This condition can often be diagnosed through a visual exam. Other exams or tests may also be done, such as:  An exam that involves feeling the rectal area with a gloved hand (digital rectal exam).  An exam of the anal canal that is done using a small tube (anoscope).  A blood test, if you have lost a significant amount of blood.  A test to look inside the colon using a flexible tube with a camera on the end (sigmoidoscopy or colonoscopy). How is this treated? This condition can usually be treated at home. However, various procedures may be done if dietary changes, lifestyle changes, and other home treatments do not help your symptoms. These procedures can help make the hemorrhoids smaller or remove them completely. Some of these procedures involve surgery, and others do not. Common procedures include:  Rubber band ligation. Rubber bands are placed at the base of the hemorrhoids to cut off their blood supply.  Sclerotherapy. Medicine is injected into the hemorrhoids to shrink them.  Infrared coagulation. A type of light energy is used to get rid of the hemorrhoids.  Hemorrhoidectomy surgery. The hemorrhoids are surgically removed, and the veins that supply them are tied off.  Stapled hemorrhoidopexy surgery. The surgeon staples the base of the hemorrhoid to the rectal wall. Follow these instructions at home: Eating and drinking   Eat foods that have a lot of fiber in them, such as whole grains, beans, nuts, fruits, and vegetables.  Ask your health care provider about taking products that have added fiber (fiber supplements).  Reduce the amount of fat in your diet. You can do this by eating low-fat dairy products, eating less red meat, and avoiding processed foods.  Drink enough fluid to keep your urine pale yellow. Managing pain and swelling   Take warm sitz baths for 20  minutes, 3-4 times a day to ease pain and discomfort. You may do this in a bathtub or using a portable sitz bath that fits over the toilet.  If directed, apply ice to the affected area. Using ice packs between sitz baths may be helpful. ? Put ice in a plastic bag. ? Place a towel between your skin and the bag. ? Leave the ice on for 20 minutes, 2-3 times a day. General instructions  Take over-the-counter and prescription medicines only as told by your health care provider.  Use medicated creams or suppositories as told.  Get regular exercise. Ask your health care provider how much and what kind of exercise is best for you. In general, you should do moderate exercise for at least 30 minutes on most days of the week (150 minutes each week). This can include activities such as walking, biking, or yoga.  Go to the bathroom when you have the urge to have a bowel movement. Do not wait.  Avoid straining to have bowel movements.  Keep  the anal area dry and clean. Use wet toilet paper or moist towelettes after a bowel movement.  Do not sit on the toilet for long periods of time. This increases blood pooling and pain.  Keep all follow-up visits as told by your health care provider. This is important. Contact a health care provider if you have:  Increasing pain and swelling that are not controlled by treatment or medicine.  Difficulty having a bowel movement, or you are unable to have a bowel movement.  Pain or inflammation outside the area of the hemorrhoids. Get help right away if you have:  Uncontrolled bleeding from your rectum. Summary  Hemorrhoids are swollen veins in and around the rectum or anus.  Most hemorrhoids can be managed with home treatments such as diet and lifestyle changes.  Taking warm sitz baths can help ease pain and discomfort.  In severe cases, procedures or surgery can be done to shrink or remove the hemorrhoids. This information is not intended to replace  advice given to you by your health care provider. Make sure you discuss any questions you have with your health care provider. Document Released: 06/23/2000 Document Revised: 07/04/2018 Document Reviewed: 11/15/2017 Elsevier Patient Education  2020 [employer].  Colonoscopy, Adult A colonoscopy is an exam to look at the entire large intestine. During the exam, a lubricated, flexible tube that has a camera on the end of it is inserted into the anus and then passed into the rectum, colon, and other parts of the large intestine. You may have a colonoscopy as a part of normal colorectal screening or if you have certain symptoms, such as:  Lack of red blood cells (anemia).  Diarrhea that does not go away.  Abdominal pain.  Blood in your stool (feces). A colonoscopy can help screen for and diagnose medical problems, including:  Tumors.  Polyps.  Inflammation.  Areas of bleeding. Tell a health care provider about:  Any allergies you have.  All medicines you are taking, including vitamins, herbs, eye drops, creams, and over-the-counter medicines.  Any problems you or family members have had with anesthetic medicines.  Any blood disorders you have.  Any surgeries you have had.  Any medical conditions you have.  Any problems you have had passing stool. What are the risks? Generally, this is a safe procedure. However, problems may occur, including:  Bleeding.  A tear in the intestine.  A reaction to medicines given during the exam.  Infection (rare). What happens before the procedure? Eating and drinking restrictions Follow instructions from your health care provider about eating and drinking, which may include:  A few days before the procedure - follow a low-fiber diet. Avoid nuts, seeds, dried fruit, raw fruits, and vegetables.  1-3 days before the procedure - follow a clear liquid diet. Drink only clear liquids, such as clear broth or bouillon, black coffee or tea,  clear juice, clear soft drinks or sports drinks, gelatin dessert, and popsicles. Avoid any liquids that contain red or purple dye.  On the day of the procedure - do not eat or drink anything starting 2 hours before the procedure, or within the time period that your health care provider recommends. Up to 2 hours before the procedure, you may continue to drink clear liquids, such as water or clear fruit juice. Bowel prep If you were prescribed an oral bowel prep to clean out your colon:  Take it as told by your health care provider. Starting the day before your procedure, you will  need to drink a large amount of medicated liquid. The liquid will cause you to have multiple loose stools until your stool is almost clear or light green.  If your skin or anus gets irritated from diarrhea, you may use these to relieve the irritation: ? Medicated wipes, such as adult wet wipes with aloe and vitamin E. ? A skin-soothing product like petroleum jelly.  If you vomit while drinking the bowel prep, take a break for up to 60 minutes and then begin the bowel prep again. If vomiting continues and you cannot take the bowel prep without vomiting, call your health care provider.  To clean out your colon, you may also be given: ? Laxative medicines. ? Instructions about how to use an enema. General instructions  Ask your health care provider about: ? Changing or stopping your regular medicines or supplements. This is especially important if you are taking iron supplements, diabetes medicines, or blood thinners. ? Taking medicines such as aspirin and ibuprofen. These medicines can thin your blood. Do not take these medicines before the procedure if your health care provider tells you not to.  Plan to have someone take you home from the hospital or clinic. What happens during the procedure?   An IV may be inserted into one of your veins.  You will be given medicine to help you relax (sedative).  To reduce  your risk of infection: ? Your health care team will wash or sanitize their hands. ? Your anal area will be washed with soap.  You will be asked to lie on your side with your knees bent.  Your health care provider will lubricate a long, thin, flexible tube. The tube will have a camera and a light on the end.  The tube will be inserted into your anus.  The tube will be gently eased through your rectum and colon.  Air will be delivered into your colon to keep it open. You may feel some pressure or cramping.  The camera will be used to take images during the procedure.  A small tissue sample may be removed to be examined under a microscope (biopsy).  If small polyps are found, your health care provider may remove them and have them checked for cancer cells.  When the exam is done, the tube will be removed. The procedure may vary among health care providers and hospitals. What happens after the procedure?  Your blood pressure, heart rate, breathing rate, and blood oxygen level will be monitored until the medicines you were given have worn off.  Do not drive for 24 hours after the exam.  You may have a small amount of blood in your stool.  You may pass gas and have mild abdominal cramping or bloating due to the air that was used to inflate your colon during the exam.  It is up to you to get the results of your procedure. Ask your health care provider, or the department performing the procedure, when your results will be ready. Summary  A colonoscopy is an exam to look at the entire large intestine.  During a colonoscopy, a lubricated, flexible tube with a camera on the end of it is inserted into the anus and then passed into the colon and other parts of the large intestine.  Follow instructions from your health care provider about eating and drinking before the procedure.  If you were prescribed an oral bowel prep to clean out your colon, take it as told by your health care  provider.  After your procedure, your blood pressure, heart rate, breathing rate, and blood oxygen level will be monitored until the medicines you were given have worn off. This information is not intended to replace advice given to you by your health care provider. Make sure you discuss any questions you have with your health care provider. Document Released: 06/23/2000 Document Revised: 04/18/2017 Document Reviewed: 09/07/2015 Elsevier Patient Education  2020 [employer].

## 2019-06-18 NOTE — Progress Notes (Signed)
I have faxed.  

## 2019-07-17 ENCOUNTER — Other Ambulatory Visit: Payer: Self-pay

## 2019-07-17 ENCOUNTER — Encounter: Payer: Self-pay | Admitting: Gastroenterology

## 2019-07-17 ENCOUNTER — Ambulatory Visit (INDEPENDENT_AMBULATORY_CARE_PROVIDER_SITE_OTHER): Payer: Commercial Managed Care - PPO | Admitting: Gastroenterology

## 2019-07-17 ENCOUNTER — Ambulatory Visit: Payer: Commercial Managed Care - PPO | Admitting: Gastroenterology

## 2019-07-17 VITALS — BP 127/83 | HR 67 | Temp 98.0°F | Ht 63.0 in | Wt 149.6 lb

## 2019-07-17 DIAGNOSIS — K641 Second degree hemorrhoids: Secondary | ICD-10-CM

## 2019-07-17 DIAGNOSIS — Z1211 Encounter for screening for malignant neoplasm of colon: Secondary | ICD-10-CM

## 2019-07-17 DIAGNOSIS — L290 Pruritus ani: Secondary | ICD-10-CM

## 2019-07-17 NOTE — Progress Notes (Signed)
Jonathon Bellows MD, MRCP(U.K) [address]  [place]  [place], [place] [address]  Main: [phone]  Fax: [phone]   Gastroenterology Consultation  Referring Provider:     [name], Olivia Mackie * Primary Care Physician:  [name], Nino Glow, MD Primary Gastroenterologist:  Dr. Jonathon Bellows  Reason for Consultation:     Rectal bleeding        HPI:   Margaret Blake is a 54 y.o. y/o female referred for consultation & management  by Dr. Terese Door, Nino Glow, MD.     She has been referred for rectal bleeding.  She states that she has occasional blood on the tissue paper after a bowel movement especially if she wipes excessively.  She complains of significant anal itching especially after cleaning herself.  She also complains of mucosal seepage.  She denies any constipation has regular bowel movements.  Rectal bleeding is very occasional.  Last colonoscopy at age of 86.  Performed when she had a stomach bug.  No family history of colon cancer or polyps.  Not on any blood thinners.  No latex allergies.  She feels occasionally a protrusion from her anus which goes back on its own spontaneously.    Past Medical History:  Diagnosis Date  . Anxiety     Past Surgical History:  Procedure Laterality Date  . TUBAL LIGATION      Prior to Admission medications   Medication Sig Start Date End Date Taking? Authorizing Provider  citalopram (CELEXA) 20 MG tablet TAKE 1 TABLET (20 MG TOTAL) DAILY BY MOUTH. 04/14/19 07/13/19  Leone Haven, MD  phentermine (ADIPEX-P) 37.5 MG tablet Take 0.5 tablets (18.75 mg total) by mouth daily before breakfast. 06/17/19   [name], Nino Glow, MD  Rhubarb (ESTROVEN MENOPAUSE RELIEF) 4 MG TABS Take by mouth.    [provider]    Family History  Problem Relation Age of Onset  . Diabetes Father      Social History   Tobacco Use  . Smoking status: Former Research scientist (life sciences)  . Smokeless tobacco: Never Used  Substance Use Topics  .  Alcohol use: Yes    Comment: socially  . Drug use: No    Allergies as of 07/17/2019  . (No Known Allergies)    Review of Systems:    All systems reviewed and negative except where noted in HPI.   Physical Exam:  BP 127/83   Pulse 67   Temp 98 F (36.7 C)   Ht 5\' 3"  (1.6 m)   Wt 149 lb 9.6 oz (67.9 kg)   BMI 26.50 kg/m  No LMP recorded. (Menstrual status: Irregular Periods). Psych:  Alert and cooperative. Normal mood and affect. General:   Alert,  Well-developed, well-nourished, pleasant and cooperative in NAD Head:  Normocephalic and atraumatic. Eyes:  Sclera clear, no icterus.   Conjunctiva pink. Ears:  Normal auditory acuity. Abdomen:  Normal bowel sounds.  No bruits.  Soft, non-tender and non-distended without masses, hepatosplenomegaly or hernias noted.  No guarding or rebound tenderness.    Neurologic:  Alert and oriented x3;  grossly normal neurologically. Skin:  Intact without significant lesions or rashes. No jaundice. Lymph Nodes:  No significant cervical adenopathy. Psych:  Alert and cooperative. Normal mood and affect. Perirectal exam: Normal external exam no abnormalities of the skin or perianal area noted.  On digital exam no fissure noted.  Anal canal appeared normal.  No masses felt. Anoscopy exam was performed and noted 2 columns of hemorrhoids right anterior right  posterior.  With verbal consent after explaining the risks and benefits of the procedure not limited to pain, infection, bleeding.  Using [place] banding device 1 band was deployed to the right anterior column.  No pain or pressure was felt.  Subsequently rectal exam was repeated and the band appeared in position with no other abnormality. Imaging Studies: No results found.  Assessment and Plan:   Margaret Blake is a 54 y.o. y/o female has been referred for rectal bleeding.  Based on her history it appears that her symptoms are probably due to second-grade internal hemorrhoids.  She does suffer from  pruritus ani.  Likely from excessive cleaning. It is also possible that the mucosal leakage is due to the hemorrhoidal column.  We discussed high-fiber diet, daily toileting hygiene.  Avoiding the use of any chemicals in the perianal area.  We also discussed that she needs to proceed with a colonoscopy for colon cancer screening due to her age.   She has agreed to do so.  We will also bring her back in 3 weeks time to repeat banding.  I have discussed alternative options, risks & benefits,  which include, but are not limited to, bleeding, infection, perforation,respiratory complication & drug reaction.  The patient agrees with this plan & written consent will be obtained.     Follow up in 3 weeks for repeat banding  Dr Jonathon Bellows MD,MRCP(U.K)

## 2019-07-26 ENCOUNTER — Other Ambulatory Visit: Payer: Self-pay | Admitting: Family Medicine

## 2019-07-29 ENCOUNTER — Other Ambulatory Visit: Payer: Self-pay

## 2019-07-29 ENCOUNTER — Other Ambulatory Visit
Admission: RE | Admit: 2019-07-29 | Discharge: 2019-07-29 | Disposition: A | Discharge status: Home / Self Care | Payer: Commercial Managed Care - PPO | Source: Ambulatory Visit | Attending: Gastroenterology | Admitting: Gastroenterology

## 2019-07-29 DIAGNOSIS — Z01812 Encounter for preprocedural laboratory examination: Secondary | ICD-10-CM | POA: Diagnosis not present

## 2019-07-29 DIAGNOSIS — Z20822 Contact with and (suspected) exposure to covid-19: Secondary | ICD-10-CM | POA: Diagnosis not present

## 2019-07-30 ENCOUNTER — Telehealth: Payer: Self-pay | Admitting: Gastroenterology

## 2019-07-30 LAB — SARS CORONAVIRUS 2 (TAT 6-24 HRS): SARS Coronavirus 2: NEGATIVE

## 2019-07-30 NOTE — Telephone Encounter (Signed)
Pt called to cancel procedure for tomorrow.

## 2019-07-30 NOTE — Telephone Encounter (Signed)
Patients colonoscopy has been canceled with Dr. Vicente Males for tomorrow with El[facility] Guadeloupe in [facility]. No reason provided for cancellation.  Referral noted.  Thanks,  Sharyn Lull

## 2019-07-31 ENCOUNTER — Ambulatory Visit
Admission: RE | Admit: 2019-07-31 | Payer: Commercial Managed Care - PPO | Source: Home / Self Care | Admitting: Gastroenterology

## 2019-07-31 ENCOUNTER — Encounter: Admission: RE | Payer: Self-pay | Source: Home / Self Care

## 2019-07-31 SURGERY — COLONOSCOPY WITH PROPOFOL
Anesthesia: General

## 2019-08-07 ENCOUNTER — Ambulatory Visit (INDEPENDENT_AMBULATORY_CARE_PROVIDER_SITE_OTHER): Payer: Commercial Managed Care - PPO | Admitting: Gastroenterology

## 2019-08-07 ENCOUNTER — Other Ambulatory Visit: Payer: Self-pay

## 2019-08-07 ENCOUNTER — Encounter: Payer: Self-pay | Admitting: Gastroenterology

## 2019-08-07 VITALS — BP 106/60 | HR 60 | Temp 97.8°F | Wt 154.5 lb

## 2019-08-07 DIAGNOSIS — K641 Second degree hemorrhoids: Secondary | ICD-10-CM

## 2019-08-07 DIAGNOSIS — L290 Pruritus ani: Secondary | ICD-10-CM | POA: Diagnosis not present

## 2019-08-07 NOTE — Progress Notes (Signed)
   Jonathon Bellows MD, MRCP(U.K) [address]  [place]  [place], [place] [address]  Main: [phone]  Fax: [phone]    Follow-up for banding of hemorrhoids  HPI: Margaret Blake is a 54 y.o. female was initially referred and seen for rectal bleeding on 07/17/2019.  Blood noted on the toilet paper after bowel movement.  Perianal itching, mucosal seepage.  Last colonoscopy at age of 29.  Performed as she had a stomach bug.  No family history of colon cancer or polyps.  Not on any blood thinners.  On 07/17/2019 right anterior hemorrhoids were banded.    Interval history 07/17/2019-08/07/2019 She did well after her first banding session no complaints.  She felt better in terms of her itching.  No more rectal bleeding.  She did not schedule a colonoscopy due to the high co-pay is aware that she needs to do so to evaluate the rectal  PROCEDURE NOTE: The patient presents with symptomatic grade 2 hemorrhoids, unresponsive to maximal medical therapy, requesting rubber band ligation of his/her hemorrhoidal disease.  All risks, benefits and alternative forms of therapy were described and informed consent was obtained.  In the Left Lateral Decubitus position (if anoscopy is performed) anoscopic examination revealed grade 1 hemorrhoids in the RP position(s).   The decision was made to band the RP internal hemorrhoid, and the [place] was used to perform band ligation without complication.  Digital anorectal examination was then performed to assure proper positioning of the band, and to adjust the banded tissue as required.  The patient was discharged home without pain or other issues.  Dietary and behavioral recommendations were given and (if necessary - prescriptions were given), along with follow-up instructions.  The patient will return 3 weeks for follow-up and possible additional banding as required.  No complications were encountered and the patient tolerated the procedure well.     Assessment and Plan:   Margaret Blake is a 54 y.o. y/o female underwent banding of the right anterior column of internal hemorrhoids 3 weeks back.  Today had the right posterior column banded.      Plan 1.  Follow-up in 3 weeks 2.  Schedule colonoscopy for evaluation of rectal bleeding which was canceled by the patient after last visit  Dr Jonathon Bellows  MD,MRCP [facility]) Follow up in 3 weeks

## 2019-09-04 ENCOUNTER — Ambulatory Visit (INDEPENDENT_AMBULATORY_CARE_PROVIDER_SITE_OTHER): Payer: Commercial Managed Care - PPO | Admitting: Gastroenterology

## 2019-09-04 ENCOUNTER — Other Ambulatory Visit: Payer: Self-pay

## 2019-09-04 VITALS — BP 120/79 | HR 71 | Temp 98.2°F | Ht 63.0 in | Wt 155.4 lb

## 2019-09-04 DIAGNOSIS — K625 Hemorrhage of anus and rectum: Secondary | ICD-10-CM

## 2019-09-04 DIAGNOSIS — K640 First degree hemorrhoids: Secondary | ICD-10-CM | POA: Diagnosis not present

## 2019-09-04 NOTE — Progress Notes (Signed)
Patient follow-ups today for banding of hemorrhoids    Summary of history :   Initially referred and seen on 07/17/2019 for rectal bleeding.  No recent colonoscopy.   First round: Right anterior hemorrhoid Second round: Right posterior hemorrhoid   Interval history   08/07/2019-09/04/2019 Doing very well feels much better after her 2 rounds of banding.   Digital rectal exam performed in the presence of a chaperone. External anal findings: No abnormalities Internal findings:No masses, no blood on glove noticed.   PROCEDURE NOTE: The patient presents with symptomatic grade 1 hemorrhoids, unresponsive to maximal medical therapy, requesting rubber band ligation of his/her hemorrhoidal disease.  All risks, benefits and alternative forms of therapy were described and informed consent was obtained.  In the Left Lateral Decubitus position (if anoscopy is performed) anoscopic examination revealed grade 1 hemorrhoids in the LL position(s).   The decision was made to band the LL internal hemorrhoid, and the [place] was used to perform band ligation without complication.  Digital anorectal examination was then performed to assure proper positioning of the band, and to adjust the banded tissue as required.  The patient was discharged home without pain or other issues.  Dietary and behavioral recommendations were given and (if necessary - prescriptions were given), along with follow-up instructions.  The patient will return as needed  as needed for follow-up and possible additional banding as required.  No complications were encountered and the patient tolerated the procedure well.  Plan:  Schedule colonoscopy as suggested at prior office visit in a couple of weeks And she left the office absolutely no pain pressure or any pinching sensation.    Dr Jonathon Bellows MD,MRCP [facility]) Gastroenterology/Hepatology Pager: [phone]

## 2019-09-16 ENCOUNTER — Telehealth: Payer: Self-pay

## 2019-09-16 NOTE — Telephone Encounter (Signed)
Noted  TMS 

## 2019-09-16 NOTE — Telephone Encounter (Signed)
FYI [name], Margaret Glow, MD   [name]

## 2019-09-16 NOTE — Telephone Encounter (Signed)
Called pt to inquire if she's decided to proceed with the colonoscopy as recommended by Dr. Vicente Males. Pt states she's not going to schedule at this time as her symptoms have resolved after the hemorrhoid banding. Pt also states she would have to pay $6500 out of pocket for the procedure therefore, pt wants to wait until she has better insurance coverage. Pt plans to contact our office if she develops any more GI issues.

## 2019-09-30 ENCOUNTER — Other Ambulatory Visit: Payer: Commercial Managed Care - PPO

## 2019-10-07 ENCOUNTER — Ambulatory Visit (INDEPENDENT_AMBULATORY_CARE_PROVIDER_SITE_OTHER): Payer: Commercial Managed Care - PPO | Admitting: Internal Medicine

## 2019-10-07 ENCOUNTER — Other Ambulatory Visit: Payer: Self-pay

## 2019-10-07 ENCOUNTER — Encounter: Payer: Self-pay | Admitting: Internal Medicine

## 2019-10-07 VITALS — BP 110/74 | HR 73 | Temp 98.0°F | Ht 63.0 in | Wt 153.6 lb

## 2019-10-07 DIAGNOSIS — E559 Vitamin D deficiency, unspecified: Secondary | ICD-10-CM

## 2019-10-07 DIAGNOSIS — Z0000 Encounter for general adult medical examination without abnormal findings: Secondary | ICD-10-CM | POA: Insufficient documentation

## 2019-10-07 DIAGNOSIS — Z1389 Encounter for screening for other disorder: Secondary | ICD-10-CM | POA: Diagnosis not present

## 2019-10-07 DIAGNOSIS — F419 Anxiety disorder, unspecified: Secondary | ICD-10-CM

## 2019-10-07 DIAGNOSIS — Z13220 Encounter for screening for lipoid disorders: Secondary | ICD-10-CM | POA: Diagnosis not present

## 2019-10-07 DIAGNOSIS — Z780 Asymptomatic menopausal state: Secondary | ICD-10-CM | POA: Insufficient documentation

## 2019-10-07 DIAGNOSIS — Z1329 Encounter for screening for other suspected endocrine disorder: Secondary | ICD-10-CM

## 2019-10-07 DIAGNOSIS — L309 Dermatitis, unspecified: Secondary | ICD-10-CM

## 2019-10-07 DIAGNOSIS — F329 Major depressive disorder, single episode, unspecified: Secondary | ICD-10-CM

## 2019-10-07 DIAGNOSIS — E663 Overweight: Secondary | ICD-10-CM

## 2019-10-07 HISTORY — DX: Encounter for general adult medical examination without abnormal findings: Z00.00

## 2019-10-07 LAB — CBC WITH DIFFERENTIAL/PLATELET
Basophils Absolute: 0.1 10*3/uL (ref 0.0–0.1)
Basophils Relative: 1.2 % (ref 0.0–3.0)
Eosinophils Absolute: 0.5 10*3/uL (ref 0.0–0.7)
Eosinophils Relative: 7.8 % — ABNORMAL HIGH (ref 0.0–5.0)
HCT: 42.7 % (ref 36.0–46.0)
Hemoglobin: 14.2 g/dL (ref 12.0–15.0)
Lymphocytes Relative: 30.9 % (ref 12.0–46.0)
Lymphs Abs: 2.1 10*3/uL (ref 0.7–4.0)
MCHC: 33.3 g/dL (ref 30.0–36.0)
MCV: 87.2 fl (ref 78.0–100.0)
Monocytes Absolute: 0.8 10*3/uL (ref 0.1–1.0)
Monocytes Relative: 11.2 % (ref 3.0–12.0)
Neutro Abs: 3.3 10*3/uL (ref 1.4–7.7)
Neutrophils Relative %: 48.9 % (ref 43.0–77.0)
Platelets: 377 10*3/uL (ref 150.0–400.0)
RBC: 4.9 Mil/uL (ref 3.87–5.11)
RDW: 13.8 % (ref 11.5–15.5)
WBC: 6.8 10*3/uL (ref 4.0–10.5)

## 2019-10-07 LAB — LIPID PANEL
Cholesterol: 243 mg/dL — ABNORMAL HIGH (ref 0–200)
HDL: 87.9 mg/dL (ref >39.00)
LDL Cholesterol: 133 mg/dL — ABNORMAL HIGH (ref 0–99)
NonHDL: 155
Total CHOL/HDL Ratio: 3
Triglycerides: 108 mg/dL (ref 0.0–149.0)
VLDL: 21.6 mg/dL (ref 0.0–40.0)

## 2019-10-07 LAB — COMPREHENSIVE METABOLIC PANEL
ALT: 16 U/L (ref 0–35)
AST: 24 U/L (ref 0–37)
Albumin: 4.6 g/dL (ref 3.5–5.2)
Alkaline Phosphatase: 108 U/L (ref 39–117)
BUN: 14 mg/dL (ref 6–23)
CO2: 37 mEq/L — ABNORMAL HIGH (ref 19–32)
Calcium: 10.4 mg/dL (ref 8.4–10.5)
Chloride: 93 mEq/L — ABNORMAL LOW (ref 96–112)
Creatinine, Ser: 0.94 mg/dL (ref 0.40–1.20)
GFR: 62.45 mL/min (ref >60.00)
Glucose, Bld: 84 mg/dL (ref 70–99)
Potassium: 3.4 mEq/L — ABNORMAL LOW (ref 3.5–5.1)
Sodium: 138 mEq/L (ref 135–145)
Total Bilirubin: 0.6 mg/dL (ref 0.2–1.2)
Total Protein: 7.4 g/dL (ref 6.0–8.3)

## 2019-10-07 LAB — VITAMIN D 25 HYDROXY (VIT D DEFICIENCY, FRACTURES): VITD: 38.63 ng/mL (ref 30.00–100.00)

## 2019-10-07 LAB — FOLLICLE STIMULATING HORMONE: FSH: 179.5 m[IU]/mL

## 2019-10-07 LAB — TSH: TSH: 2.12 u[IU]/mL (ref 0.35–4.50)

## 2019-10-07 MED ORDER — HYDROCORTISONE 2.5 % EX CREA: TOPICAL_CREAM | Freq: Two times a day (BID) | CUTANEOUS | 11 refills | Status: AC

## 2019-10-07 MED ORDER — PAROXETINE HCL 20 MG PO TABS: 20.0000 mg | ORAL_TABLET | Freq: Every day | ORAL | 3 refills | Status: DC

## 2019-10-07 MED ORDER — PHENTERMINE HCL 37.5 MG PO TABS: 18.7500 mg | ORAL_TABLET | ORAL | Status: DC

## 2019-10-07 NOTE — Progress Notes (Signed)
Chief Complaint  Patient presents with  . Follow-up  . Labs Only    pt is fasting  . Medication Management    phentermine (ADIPEX-P) 37.5 MG  half tablet daily pt could not tolerate. Would like to discuss taking 0.5 tablet every other day.    Annual  1. Anxiety depression doing well on celexa 20 mg qd but still having hot flashes does not want to be on effexor due to reduced libido in the past and gabapentin made her feel drunk on estroven meno relief and stress max 1 daily  2. Hemorrhoids doing better s/p bands x 3 and cancelled colonoscopy for now  3. Declines mammogram for now 4. Overweight wants to take adipex 18.75 mg qod not daily caused increased palpitations, and racing thoughts wants to start lower dose    Review of Systems  Constitutional: Negative for weight loss.  HENT: Negative for hearing loss.   Eyes: Negative for blurred vision.  Respiratory: Negative for shortness of breath.   Cardiovascular: Negative for chest pain.  Gastrointestinal: Negative for abdominal pain.  Genitourinary:       +hot flashes   Musculoskeletal: Negative for falls.  Skin: Negative for rash.  Neurological: Negative for headaches.  Psychiatric/Behavioral: Negative for memory loss.   Past Medical History:  Diagnosis Date  . Anxiety    Past Surgical History:  Procedure Laterality Date  . TUBAL LIGATION     Family History  Problem Relation Age of Onset  . Diabetes Father    Social History   Socioeconomic History  . Marital status: Married    Spouse name: Not on file  . Number of children: Not on file  . Years of education: Not on file  . Highest education level: Not on file  Occupational History  . Not on file  Tobacco Use  . Smoking status: Former Research scientist (life sciences)  . Smokeless tobacco: Never Used  Substance and Sexual Activity  . Alcohol use: Yes    Comment: socially  . Drug use: No  . Sexual activity: Yes    Birth control/protection: None  Other Topics Concern  . Not on file   Social History Narrative   3 kids    1 is [place] also Dr. [facility] patient    Former smoker quit in 2014 smoked x 35 years 1 ppd    Social Determinants of Radio broadcast assistant Strain:   . Difficulty of Paying Living Expenses:   Food Insecurity:   . Worried About Charity fundraiser in the Last Year:   . Arboriculturist in the Last Year:   Transportation Needs:   . Film/video editor (Medical):   Marland Kitchen Lack of Transportation (Non-Medical):   Physical Activity:   . Days of Exercise per Week:   . Minutes of Exercise per Session:   Stress:   . Feeling of Stress :   Social Connections:   . Frequency of Communication with Friends and Family:   . Frequency of Social Gatherings with Friends and Family:   . Attends Religious Services:   . Active Member of Clubs or Organizations:   . Attends Archivist Meetings:   Marland Kitchen Marital Status:   Intimate Partner Violence:   . Fear of Current or Ex-Partner:   . Emotionally Abused:   Marland Kitchen Physically Abused:   . Sexually Abused:    Current Meds  Medication Sig  . Rhubarb (ESTROVEN MENOPAUSE RELIEF) 4 MG TABS Take by mouth.  . [DISCONTINUED] citalopram (  CELEXA) 20 MG tablet TAKE 1 TABLET (20 MG TOTAL) DAILY BY MOUTH.  . [DISCONTINUED] phentermine (ADIPEX-P) 37.5 MG tablet Take 37.5 mg by mouth daily before breakfast.  . phentermine (ADIPEX-P) 37.5 MG tablet Take 0.5 tablets (18.75 mg total) by mouth every other day. Before breakfast   No Known Allergies Recent Results (from the past 2160 hour(s))  SARS CORONAVIRUS 2 (TAT 6-24 HRS) Nasopharyngeal Nasopharyngeal Swab     Status: None   Collection Time: 07/29/19  1:55 PM   Specimen: Nasopharyngeal Swab  Result Value Ref Range   SARS Coronavirus 2 NEGATIVE NEGATIVE    Comment: (NOTE) SARS-CoV-2 target nucleic acids are NOT DETECTED. The SARS-CoV-2 RNA is generally detectable in upper and lower respiratory specimens during the acute phase of infection. Negative results do not preclude  SARS-CoV-2 infection, do not rule out co-infections with other pathogens, and should not be used as the sole basis for treatment or other patient management decisions. Negative results must be combined with clinical observations, patient history, and epidemiological information. The expected result is Negative. Fact Sheet for Patients: SugarRoll.be Fact Sheet for Healthcare Providers: https://www.woods-mathews.com/ This test is not yet approved or cleared by the [place] FDA and  has been authorized for detection and/or diagnosis of SARS-CoV-2 by FDA under an Emergency Use Authorization (EUA). This EUA will remain  in effect (meaning this test can be used) for the duration of the COVID-19 declaration under Section 56 4(b)(1) of the Act, 21 U.S.C. section 360bbb-3(b)(1), unless the authorization is terminated or revoked sooner. Performed at [facility], [place] [address], [place], [place] [address]    Objective  Body mass index is 27.21 kg/m. Wt Readings from Last 3 Encounters:  10/07/19 153 lb 9.6 oz (69.7 kg)  09/04/19 155 lb 6.4 oz (70.5 kg)  08/07/19 154 lb 8 oz (70.1 kg)   Temp Readings from Last 3 Encounters:  10/07/19 98 F (36.7 C) (Temporal)  09/04/19 98.2 F (36.8 C)  08/07/19 97.8 F (36.6 C) (Oral)   BP Readings from Last 3 Encounters:  10/07/19 110/74  09/04/19 120/79  08/07/19 106/60   Pulse Readings from Last 3 Encounters:  10/07/19 73  09/04/19 71  08/07/19 60    Physical Exam Vitals and nursing note reviewed.  Constitutional:      Appearance: Normal appearance.  HENT:     Head: Normocephalic and atraumatic.  Eyes:     Conjunctiva/sclera: Conjunctivae normal.     Pupils: Pupils are equal, round, and reactive to light.  Cardiovascular:     Rate and Rhythm: Normal rate and regular rhythm.     Heart sounds: No murmur.  Pulmonary:     Effort: Pulmonary effort is normal.     Breath sounds: Normal  breath sounds.  Abdominal:     General: Abdomen is flat. Bowel sounds are normal.     Tenderness: There is no abdominal tenderness.  Skin:    General: Skin is warm and dry.  Neurological:     General: No focal deficit present.     Mental Status: She is alert and oriented to person, place, and time. Mental status is at baseline.  Psychiatric:        Mood and Affect: Mood normal.        Behavior: Behavior normal.        Thought Content: Thought content normal.        Judgment: Judgment normal.     Assessment  Plan  Annual physical exam -  Fasting labs today  Flu shot utd  Check Tdap at work thinks had in 2016 shingrix disc at f/u  covid 2/2  mammo referred [facility] due 09/2019 pt wants to do 3/22 not 09/2019   Pap [facility] ? 2018 try to get record as of 06/23/19 no records  -sch pap 03/2020   colonoscopy referred [facility] today severe hemorrhoids x 1 year with clear mucous leakage daily and brbpr S/p banding x 3 hemorrhoids less protruding with BM Dr. Vicente Males  -hold colonoscopy for now had 1 in 35s as of 09/2019 pt wants to wait   Derm hold on referral   dexa age 37  Former smoker quit in 2014 smoked x 35 years 1 ppd  D3 rec 4000 IU qd and mvt otc qd  rec healthy diet and exercise   Vitamin D deficiency - Plan: Vitamin D (25 hydroxy)  Eczema, unspecified type - Plan: hydrocortisone 2.5 % cream  Menopause - Plan: FSH  Anxiety and depression - Plan: PARoxetine (PAXIL) 20 MG tablet taper off celexa   Overweight (BMI 25.0-29.9) - Plan: phentermine (ADIPEX-P) 18.Marland Kitchen75 MG tablet  Qod 4 months on and 3 months off  Provider: Dr. Olivia Mackie [name]-Internal Medicine

## 2019-10-07 NOTE — Patient Instructions (Addendum)
adipex 1/2 pill 4 months on and 3 months off   Celexa 1/2 pill x 1 week, then every other day x 3 doses and transition to paxil   Cetaphil/cerave cream   Let me know if paxil 20 mg does not help we can consider changing to to extended release 25 mg   Paroxetine tablets What is this medicine? PAROXETINE (pa ROX e teen) is used to treat depression. It may also be used to treat anxiety disorders, obsessive compulsive disorder, panic attacks, post traumatic stress, and premenstrual dysphoric disorder (PMDD). This medicine may be used for other purposes; ask your health care provider or pharmacist if you have questions. COMMON BRAND NAME(S): Paxil, Pexeva What should I tell my health care provider before I take this medicine? They need to know if you have any of these conditions:  bipolar disorder or a family history of bipolar disorder  bleeding disorders  glaucoma  heart disease  kidney disease  liver disease  low levels of sodium in the blood  seizures  suicidal thoughts, plans, or attempt; a previous suicide attempt by you or a family member  take MAOIs like Carbex, Eldepryl, Marplan, Nardil, and Parnate  take medicines that treat or prevent blood clots  thyroid disease  an unusual or allergic reaction to paroxetine, other medicines, foods, dyes, or preservatives  pregnant or trying to get pregnant  breast-feeding How should I use this medicine? Take this medicine by mouth with a glass of water. Follow the directions on the prescription label. You can take it with or without food. Take your medicine at regular intervals. Do not take your medicine more often than directed. Do not stop taking this medicine suddenly except upon the advice of your doctor. Stopping this medicine too quickly may cause serious side effects or your condition may worsen. A special MedGuide will be given to you by the pharmacist with each prescription and refill. Be sure to read this information  carefully each time. Talk to your pediatrician regarding the use of this medicine in children. Special care may be needed. Overdosage: If you think you have taken too much of this medicine contact a poison control center or emergency room at once. NOTE: This medicine is only for you. Do not share this medicine with others. What if I miss a dose? If you miss a dose, take it as soon as you can. If it is almost time for your next dose, take only that dose. Do not take double or extra doses. What may interact with this medicine? Do not take this medicine with any of the following medications:  linezolid  MAOIs like Carbex, Eldepryl, Marplan, Nardil, and Parnate  methylene blue (injected into a vein)  pimozide  thioridazine This medicine may also interact with the following medications:  alcohol  amphetamines  aspirin and aspirin-like medicines  atomoxetine  certain medicines for depression, anxiety, or psychotic disturbances  certain medicines for irregular heart beat like propafenone, flecainide, encainide, and quinidine  certain medicines for migraine headache like almotriptan, eletriptan, frovatriptan, naratriptan, rizatriptan, sumatriptan, zolmitriptan  cimetidine  digoxin  diuretics  fentanyl  fosamprenavir  furazolidone  isoniazid  lithium  medicines that treat or prevent blood clots like warfarin, enoxaparin, and dalteparin  medicines for sleep  NSAIDs, medicines for pain and inflammation, like ibuprofen or naproxen  phenobarbital  phenytoin  procarbazine  rasagiline  ritonavir  supplements like St. John's wort, kava kava, valerian  tamoxifen  tramadol  tryptophan This list may not describe all  possible interactions. Give your health care provider a list of all the medicines, herbs, non-prescription drugs, or dietary supplements you use. Also tell them if you smoke, drink alcohol, or use illegal drugs. Some items may interact with your  medicine. What should I watch for while using this medicine? Tell your doctor if your symptoms do not get better or if they get worse. Visit your doctor or health care professional for regular checks on your progress. Because it may take several weeks to see the full effects of this medicine, it is important to continue your treatment as prescribed by your doctor. Patients and their families should watch out for new or worsening thoughts of suicide or depression. Also watch out for sudden changes in feelings such as feeling anxious, agitated, panicky, irritable, hostile, aggressive, impulsive, severely restless, overly excited and hyperactive, or not being able to sleep. If this happens, especially at the beginning of treatment or after a change in dose, call your health care professional. Dennis Bast may get drowsy or dizzy. Do not drive, use machinery, or do anything that needs mental alertness until you know how this medicine affects you. Do not stand or sit up quickly, especially if you are an older patient. This reduces the risk of dizzy or fainting spells. Alcohol may interfere with the effect of this medicine. Avoid alcoholic drinks. Your mouth may get dry. Chewing sugarless gum or sucking hard candy, and drinking plenty of water will help. Contact your doctor if the problem does not go away or is severe. What side effects may I notice from receiving this medicine? Side effects that you should report to your doctor or health care professional as soon as possible:  allergic reactions like skin rash, itching or hives, swelling of the face, lips, or tongue  anxious  black, tarry stools  changes in vision  confusion  elevated mood, decreased need for sleep, racing thoughts, impulsive behavior  eye pain  fast, irregular heartbeat  feeling faint or lightheaded, falls  feeling agitated, angry, or irritable  hallucination, loss of contact with reality  loss of balance or coordination  loss of  memory  painful or prolonged erections  restlessness, pacing, inability to keep still  seizures  stiff muscles  suicidal thoughts or other mood changes  trouble sleeping  unusual bleeding or bruising  unusually weak or tired  vomiting Side effects that usually do not require medical attention (report to your doctor or health care professional if they continue or are bothersome):  change in appetite or weight  change in sex drive or performance  diarrhea  dizziness  dry mouth  increased sweating  indigestion, nausea  tired  tremors This list may not describe all possible side effects. Call your doctor for medical advice about side effects. You may report side effects to FDA at 1-800-FDA-1088. Where should I keep my medicine? Keep out of the reach of children. Store at room temperature between 15 and 30 degrees C (59 and 86 degrees F). Keep container tightly closed. Throw away any unused medicine after the expiration date. NOTE: This sheet is a summary. It may not cover all possible information. If you have questions about this medicine, talk to your doctor, pharmacist, or health care provider.  2020 Elsevier/Gold Standard (2015-11-27 15:50:32)

## 2019-10-08 LAB — URINALYSIS, ROUTINE W REFLEX MICROSCOPIC
Bacteria, UA: NONE SEEN /HPF
Bilirubin Urine: NEGATIVE
Glucose, UA: NEGATIVE
Hgb urine dipstick: NEGATIVE
Hyaline Cast: NONE SEEN /LPF
Ketones, ur: NEGATIVE
Nitrite: NEGATIVE
Specific Gravity, Urine: 1.017 (ref 1.001–1.03)
pH: >=8.5 — AB (ref 5.0–8.0)

## 2019-10-09 ENCOUNTER — Encounter: Payer: Self-pay | Admitting: Internal Medicine

## 2019-10-09 NOTE — Telephone Encounter (Signed)
See result notes. 

## 2019-12-22 ENCOUNTER — Other Ambulatory Visit: Payer: Self-pay

## 2019-12-22 ENCOUNTER — Other Ambulatory Visit: Payer: Self-pay | Admitting: Family Medicine

## 2019-12-22 ENCOUNTER — Telehealth: Payer: Self-pay | Admitting: Internal Medicine

## 2019-12-22 NOTE — Telephone Encounter (Signed)
Left message informing her that a year supply has been sent in 10/07/19 to her pharmacy.

## 2019-12-22 NOTE — Telephone Encounter (Signed)
Patient needs a refill on her PARoxetine (PAXIL) 20 MG tablet.

## 2019-12-22 NOTE — Telephone Encounter (Signed)
09/2019 appt she was to taper off celexa and start paxil which helps more with hot flashes  Does she want to switch to paxil or stay on celexa?   MTS

## 2019-12-23 NOTE — Telephone Encounter (Signed)
See phone note need clarification

## 2019-12-24 ENCOUNTER — Other Ambulatory Visit: Payer: Self-pay | Admitting: Internal Medicine

## 2019-12-24 DIAGNOSIS — F339 Major depressive disorder, recurrent, unspecified: Secondary | ICD-10-CM

## 2019-12-24 DIAGNOSIS — F419 Anxiety disorder, unspecified: Secondary | ICD-10-CM

## 2019-12-24 MED ORDER — CITALOPRAM HYDROBROMIDE 20 MG PO TABS: 20.0000 mg | ORAL_TABLET | Freq: Every day | ORAL | 3 refills | Status: DC

## 2019-12-24 NOTE — Addendum Note (Signed)
Addended by: Thressa Sheller on: 12/24/2019 08:26 AM   Modules accepted: Orders

## 2019-12-24 NOTE — Telephone Encounter (Signed)
Patient states she got the Paxil filled. She then states the Citalopram works great for her and she wants to stay on this.   Pended for your approval or denial.

## 2019-12-24 NOTE — Telephone Encounter (Signed)
Sent celexa instead

## 2020-01-08 ENCOUNTER — Telehealth: Payer: Self-pay | Admitting: Internal Medicine

## 2020-01-08 NOTE — Telephone Encounter (Signed)
Faxed prescriber response form to [facility] on 01-08-20

## 2020-04-08 ENCOUNTER — Other Ambulatory Visit: Payer: Self-pay

## 2020-04-08 ENCOUNTER — Encounter: Payer: Self-pay | Admitting: Internal Medicine

## 2020-04-08 ENCOUNTER — Other Ambulatory Visit (HOSPITAL_COMMUNITY)
Admission: RE | Admit: 2020-04-08 | Discharge: 2020-04-08 | Disposition: A | Discharge status: Home / Self Care | Payer: Commercial Managed Care - PPO | Source: Ambulatory Visit | Attending: Internal Medicine | Admitting: Internal Medicine

## 2020-04-08 ENCOUNTER — Ambulatory Visit (INDEPENDENT_AMBULATORY_CARE_PROVIDER_SITE_OTHER): Payer: Commercial Managed Care - PPO | Admitting: Internal Medicine

## 2020-04-08 VITALS — BP 98/64 | HR 86 | Temp 97.8°F | Ht 63.0 in | Wt 157.0 lb

## 2020-04-08 DIAGNOSIS — E785 Hyperlipidemia, unspecified: Secondary | ICD-10-CM

## 2020-04-08 DIAGNOSIS — M545 Low back pain: Secondary | ICD-10-CM

## 2020-04-08 DIAGNOSIS — Z124 Encounter for screening for malignant neoplasm of cervix: Secondary | ICD-10-CM | POA: Diagnosis not present

## 2020-04-08 DIAGNOSIS — Z0000 Encounter for general adult medical examination without abnormal findings: Secondary | ICD-10-CM | POA: Diagnosis not present

## 2020-04-08 DIAGNOSIS — Z1211 Encounter for screening for malignant neoplasm of colon: Secondary | ICD-10-CM | POA: Diagnosis not present

## 2020-04-08 DIAGNOSIS — G8929 Other chronic pain: Secondary | ICD-10-CM

## 2020-04-08 DIAGNOSIS — R87610 Atypical squamous cells of undetermined significance on cytologic smear of cervix (ASC-US): Secondary | ICD-10-CM

## 2020-04-08 DIAGNOSIS — Z1329 Encounter for screening for other suspected endocrine disorder: Secondary | ICD-10-CM

## 2020-04-08 DIAGNOSIS — E876 Hypokalemia: Secondary | ICD-10-CM

## 2020-04-08 MED ORDER — CYCLOBENZAPRINE HCL 5 MG PO TABS: 5.0000 mg | ORAL_TABLET | Freq: Every evening | ORAL | 5 refills | Status: DC | PRN

## 2020-04-08 NOTE — Progress Notes (Signed)
Chief Complaint  Patient presents with  . Follow-up  . Gynecologic Exam   F/u  1. Pap today  2. Right lower back pain 3/10 improving has icy hot patch on there and helping bending worse while at work  Declines Xray today will call back prn  3. Agreeable to colonoscopy    Review of Systems  Constitutional: Negative for weight loss.  HENT: Negative for hearing loss.   Eyes: Negative for blurred vision.  Respiratory: Negative for shortness of breath.   Cardiovascular: Negative for chest pain.  Gastrointestinal: Negative for abdominal pain.  Musculoskeletal: Positive for back pain.  Skin: Negative for rash.  Neurological: Negative for headaches.  Psychiatric/Behavioral: Negative for depression.   Past Medical History:  Diagnosis Date  . Anxiety    Past Surgical History:  Procedure Laterality Date  . TUBAL LIGATION     Family History  Problem Relation Age of Onset  . Diabetes Father    Social History   Socioeconomic History  . Marital status: Married    Spouse name: Not on file  . Number of children: Not on file  . Years of education: Not on file  . Highest education level: Not on file  Occupational History  . Not on file  Tobacco Use  . Smoking status: Former Research scientist (life sciences)  . Smokeless tobacco: Never Used  Vaping Use  . Vaping Use: Never used  Substance and Sexual Activity  . Alcohol use: Yes    Comment: socially  . Drug use: No  . Sexual activity: Yes    Birth control/protection: None  Other Topics Concern  . Not on file  Social History Narrative   3 kids    1 is [place] also [facility] patient    Former smoker quit in 2014 smoked x 35 years 1 ppd    Social Determinants of Radio broadcast assistant Strain:   . Difficulty of Paying Living Expenses: Not on file  Food Insecurity:   . Worried About Charity fundraiser in the Last Year: Not on file  . Ran Out of Food in the Last Year: Not on file  Transportation Needs:   . Lack of Transportation (Medical): Not  on file  . Lack of Transportation (Non-Medical): Not on file  Physical Activity:   . Days of Exercise per Week: Not on file  . Minutes of Exercise per Session: Not on file  Stress:   . Feeling of Stress : Not on file  Social Connections:   . Frequency of Communication with Friends and Family: Not on file  . Frequency of Social Gatherings with Friends and Family: Not on file  . Attends Religious Services: Not on file  . Active Member of Clubs or Organizations: Not on file  . Attends Archivist Meetings: Not on file  . Marital Status: Not on file  Intimate Partner Violence:   . Fear of Current or Ex-Partner: Not on file  . Emotionally Abused: Not on file  . Physically Abused: Not on file  . Sexually Abused: Not on file   Current Meds  Medication Sig  . hydrocortisone 2.5 % cream Apply topically 2 (two) times daily. Prn face and right upper arm  . phentermine (ADIPEX-P) 37.5 MG tablet Take 0.5 tablets (18.75 mg total) by mouth every other day. Before breakfast  . Rhubarb (ESTROVEN MENOPAUSE RELIEF) 4 MG TABS Take by mouth.   No Known Allergies No results found for this or any previous visit (from the past  2160 hour(s)). Objective  Body mass index is 27.81 kg/m. Wt Readings from Last 3 Encounters:  04/08/20 157 lb (71.2 kg)  10/07/19 153 lb 9.6 oz (69.7 kg)  09/04/19 155 lb 6.4 oz (70.5 kg)   Temp Readings from Last 3 Encounters:  04/08/20 97.8 F (36.6 C) (Oral)  10/07/19 98 F (36.7 C) (Temporal)  09/04/19 98.2 F (36.8 C)   BP Readings from Last 3 Encounters:  04/08/20 98/64  10/07/19 110/74  09/04/19 120/79   Pulse Readings from Last 3 Encounters:  04/08/20 86  10/07/19 73  09/04/19 71    Physical Exam Vitals and nursing note reviewed. Exam conducted with a chaperone present.  Constitutional:      Appearance: Normal appearance. She is well-developed and well-groomed.  HENT:     Head: Normocephalic and atraumatic.  Eyes:     Conjunctiva/sclera:  Conjunctivae normal.     Pupils: Pupils are equal, round, and reactive to light.  Cardiovascular:     Rate and Rhythm: Normal rate and regular rhythm.     Heart sounds: Normal heart sounds. No murmur heard.   Pulmonary:     Effort: Pulmonary effort is normal.     Breath sounds: Normal breath sounds.  Genitourinary:    Pubic Area: No rash.      Labia:        Right: No rash.        Left: No rash.      Vagina: Normal.     Cervix: Discharge present.     Uterus: Normal.      Adnexa: Right adnexa normal and left adnexa normal.  Skin:    General: Skin is warm and dry.  Neurological:     General: No focal deficit present.     Mental Status: She is alert and oriented to person, place, and time. Mental status is at baseline.     Gait: Gait normal.  Psychiatric:        Attention and Perception: Attention and perception normal.        Mood and Affect: Mood and affect normal.        Speech: Speech normal.        Behavior: Behavior normal. Behavior is cooperative.        Thought Content: Thought content normal.        Cognition and Memory: Cognition and memory normal.        Judgment: Judgment normal.     Assessment  Plan  Chronic bilateral low back pain without sciatica - Plan: DG Lumbar Spine Complete, cyclobenzaprine (FLEXERIL) 5 MG tablet  Encounter for screening colonoscopy - Plan: Ambulatory referral to Gastroenterology  HM Fasting labs 10/06/20 Flu shot utd last Friday 04/02/20 Check Tdap at work thinks had in 2016 shingrix disc at f/u  covid 2/2 +abx 500s per pt as of 04/08/20  mammo referred [facility] due 09/2019 pt wants to do 3/22 not 09/2019   Pap [facility] ? 2018 try to get recordas of 06/23/19 no records -pap today  colonoscopy [facility]  severe hemorrhoids x 1 year with clear mucous leakage daily and brbpr S/p banding x 3 hemorrhoids less protruding with BM Dr. Byrd Hesselbach hold on referral   dexa age 74  Former smoker quit in 2014 smoked x 35 years 1 ppd  D3  rec 4000 IU qd and mvt otc qd rec healthy diet and exercise    Provider: Dr. Olivia Mackie [name]-[facility]

## 2020-04-08 NOTE — Patient Instructions (Addendum)
Hypokalemia Hypokalemia means that the amount of potassium in the blood is lower than normal. Potassium is a chemical (electrolyte) that helps regulate the amount of fluid in the body. It also stimulates muscle tightening (contraction) and helps nerves work properly. Normally, most of the body's potassium is inside cells, and only a very small amount is in the blood. Because the amount in the blood is so small, minor changes to potassium levels in the blood can be life-threatening. What are the causes? This condition may be caused by:  Antibiotic medicine.  Diarrhea or vomiting. Taking too much of a medicine that helps you have a bowel movement (laxative) can cause diarrhea and lead to hypokalemia.  Chronic kidney disease (CKD).  Medicines that help the body get rid of excess fluid (diuretics).  Eating disorders, such as bulimia.  Low magnesium levels in the body.  Sweating a lot. What are the signs or symptoms? Symptoms of this condition include:  Weakness.  Constipation.  Fatigue.  Muscle cramps.  Mental confusion.  Skipped heartbeats or irregular heartbeat (palpitations).  Tingling or numbness. How is this diagnosed? This condition is diagnosed with a blood test. How is this treated? This condition may be treated by:  Taking potassium supplements by mouth.  Adjusting the medicines that you take.  Eating more foods that contain a lot of potassium. If your potassium level is very low, you may need to get potassium through an IV and be monitored in the hospital. Follow these instructions at home:   Take over-the-counter and prescription medicines only as told by your health care provider. This includes vitamins and supplements.  Eat a healthy diet. A healthy diet includes fresh fruits and vegetables, whole grains, healthy fats, and lean proteins.  If instructed, eat more foods that contain a lot of potassium. This includes: ? Nuts, such as peanuts and  pistachios. ? Seeds, such as sunflower seeds and pumpkin seeds. ? Peas, lentils, and lima beans. ? Whole grain and bran cereals and breads. ? Fresh fruits and vegetables, such as apricots, avocado, bananas, cantaloupe, kiwi, oranges, tomatoes, asparagus, and potatoes. ? Orange juice. ? Tomato juice. ? Red meats. ? Yogurt.  Keep all follow-up visits as told by your health care provider. This is important. Contact a health care provider if you:  Have weakness that gets worse.  Feel your heart pounding or racing.  Vomit.  Have diarrhea.  Have diabetes (diabetes mellitus) and you have trouble keeping your blood sugar (glucose) in your target range. Get help right away if you:  Have chest pain.  Have shortness of breath.  Have vomiting or diarrhea that lasts for more than 2 days.  Faint. Summary  Hypokalemia means that the amount of potassium in the blood is lower than normal.  This condition is diagnosed with a blood test.  Hypokalemia may be treated by taking potassium supplements, adjusting the medicines that you take, or eating more foods that are high in potassium.  If your potassium level is very low, you may need to get potassium through an IV and be monitored in the hospital. This information is not intended to replace advice given to you by your health care provider. Make sure you discuss any questions you have with your health care provider. Document Revised: 02/06/2018 Document Reviewed: 02/06/2018 Elsevier Patient Education  [place] [facility] Ask your health care provider which exercises are safe for you. Do exercises exactly as told by your health care provider  and adjust them as directed. It is normal to feel mild stretching, pulling, tightness, or discomfort as you do these exercises. Stop right away if you feel sudden pain or your pain gets worse. Do not begin these exercises until told by your health care  provider. Stretching and range-of-motion exercises These exercises warm up your muscles and joints and improve the movement and flexibility of your back. These exercises also help to relieve pain, numbness, and tingling. Lumbar rotation  1. Lie on your back on a firm surface and bend your knees. 2. Straighten your arms out to your sides so each arm forms a 90-degree angle (right angle) with a side of your body. 3. Slowly move (rotate) both of your knees to one side of your body until you feel a stretch in your lower back (lumbar). Try not to let your shoulders lift off the floor. 4. Hold this position for __________ seconds. 5. Tense your abdominal muscles and slowly move your knees back to the starting position. 6. Repeat this exercise on the other side of your body. Repeat __________ times. Complete this exercise __________ times a day. Single knee to chest  1. Lie on your back on a firm surface with both legs straight. 2. Bend one of your knees. Use your hands to move your knee up toward your chest until you feel a gentle stretch in your lower back and buttock. ? Hold your leg in this position by holding on to the front of your knee. ? Keep your other leg as straight as possible. 3. Hold this position for __________ seconds. 4. Slowly return to the starting position. 5. Repeat with your other leg. Repeat __________ times. Complete this exercise __________ times a day. Prone extension on elbows  1. Lie on your abdomen on a firm surface (prone position). 2. Prop yourself up on your elbows. 3. Use your arms to help lift your chest up until you feel a gentle stretch in your abdomen and your lower back. ? This will place some of your body weight on your elbows. If this is uncomfortable, try stacking pillows under your chest. ? Your hips should stay down, against the surface that you are lying on. Keep your hip and back muscles relaxed. 4. Hold this position for __________  seconds. 5. Slowly relax your upper body and return to the starting position. Repeat __________ times. Complete this exercise __________ times a day. Strengthening exercises These exercises build strength and endurance in your back. Endurance is the ability to use your muscles for a long time, even after they get tired. Pelvic tilt This exercise strengthens the muscles that lie deep in the abdomen. 1. Lie on your back on a firm surface. Bend your knees and keep your feet flat on the floor. 2. Tense your abdominal muscles. Tip your pelvis up toward the ceiling and flatten your lower back into the floor. ? To help with this exercise, you may place a small towel under your lower back and try to push your back into the towel. 3. Hold this position for __________ seconds. 4. Let your muscles relax completely before you repeat this exercise. Repeat __________ times. Complete this exercise __________ times a day. Alternating arm and leg raises  1. Get on your hands and knees on a firm surface. If you are on a hard floor, you may want to use padding, such as an exercise mat, to cushion your knees. 2. Line up your arms and legs. Your hands should be directly  below your shoulders, and your knees should be directly below your hips. 3. Lift your left leg behind you. At the same time, raise your right arm and straighten it in front of you. ? Do not lift your leg higher than your hip. ? Do not lift your arm higher than your shoulder. ? Keep your abdominal and back muscles tight. ? Keep your hips facing the ground. ? Do not arch your back. ? Keep your balance carefully, and do not hold your breath. 4. Hold this position for __________ seconds. 5. Slowly return to the starting position. 6. Repeat with your right leg and your left arm. Repeat __________ times. Complete this exercise __________ times a day. Abdominal set with straight leg raise  1. Lie on your back on a firm surface. 2. Bend one of your  knees and keep your other leg straight. 3. Tense your abdominal muscles and lift your straight leg up, 4-6 inches (10-15 cm) off the ground. 4. Keep your abdominal muscles tight and hold this position for __________ seconds. ? Do not hold your breath. ? Do not arch your back. Keep it flat against the ground. 5. Keep your abdominal muscles tense as you slowly lower your leg back to the starting position. 6. Repeat with your other leg. Repeat __________ times. Complete this exercise __________ times a day. Single leg lower with bent knees 1. Lie on your back on a firm surface. 2. Tense your abdominal muscles and lift your feet off the floor, one foot at a time, so your knees and hips are bent in 90-degree angles (right angles). ? Your knees should be over your hips and your lower legs should be parallel to the floor. 3. Keeping your abdominal muscles tense and your knee bent, slowly lower one of your legs so your toe touches the ground. 4. Lift your leg back up to return to the starting position. ? Do not hold your breath. ? Do not let your back arch. Keep your back flat against the ground. 5. Repeat with your other leg. Repeat __________ times. Complete this exercise __________ times a day. Posture and body mechanics Good posture and healthy body mechanics can help to relieve stress in your body's tissues and joints. Body mechanics refers to the movements and positions of your body while you do your daily activities. Posture is part of body mechanics. Good posture means:  Your spine is in its natural S-curve position (neutral).  Your shoulders are pulled back slightly.  Your head is not tipped forward. Follow these guidelines to improve your posture and body mechanics in your everyday activities. Standing   When standing, keep your spine neutral and your feet about hip width apart. Keep a slight bend in your knees. Your ears, shoulders, and hips should line up.  When you do a task in  which you stand in one place for a long time, place one foot up on a stable object that is 2-4 inches (5-10 cm) high, such as a footstool. This helps keep your spine neutral. Sitting   When sitting, keep your spine neutral and keep your feet flat on the floor. Use a footrest, if necessary, and keep your thighs parallel to the floor. Avoid rounding your shoulders, and avoid tilting your head forward.  When working at a desk or a computer, keep your desk at a height where your hands are slightly lower than your elbows. Slide your chair under your desk so you are close enough to maintain good  posture.  When working at a computer, place your monitor at a height where you are looking straight ahead and you do not have to tilt your head forward or downward to look at the screen. Resting  When lying down and resting, avoid positions that are most painful for you.  If you have pain with activities such as sitting, bending, stooping, or squatting, lie in a position in which your body does not bend very much. For example, avoid curling up on your side with your arms and knees near your chest (fetal position).  If you have pain with activities such as standing for a long time or reaching with your arms, lie with your spine in a neutral position and bend your knees slightly. Try the following positions: ? Lying on your side with a pillow between your knees. ? Lying on your back with a pillow under your knees. Lifting   When lifting objects, keep your feet at least shoulder width apart and tighten your abdominal muscles.  Bend your knees and hips and keep your spine neutral. It is important to lift using the strength of your legs, not your back. Do not lock your knees straight out.  Always ask for help to lift heavy or awkward objects. This information is not intended to replace advice given to you by your health care provider. Make sure you discuss any questions you have with your health care  provider. Document Revised: 10/18/2018 Document Reviewed: 07/18/2018 Elsevier Patient Education  [place].  Back Exercises The following exercises strengthen the muscles that help to support the trunk and back. They also help to keep the lower back flexible. Doing these exercises can help to prevent back pain or lessen existing pain.  If you have back pain or discomfort, try doing these exercises 2-3 times each day or as told by your health care provider.  As your pain improves, do them once each day, but increase the number of times that you repeat the steps for each exercise (do more repetitions).  To prevent the recurrence of back pain, continue to do these exercises once each day or as told by your health care provider. Do exercises exactly as told by your health care provider and adjust them as directed. It is normal to feel mild stretching, pulling, tightness, or discomfort as you do these exercises, but you should stop right away if you feel sudden pain or your pain gets worse. Exercises Single knee to chest Repeat these steps 3-5 times for each leg: 1. Lie on your back on a firm bed or the floor with your legs extended. 2. Bring one knee to your chest. Your other leg should stay extended and in contact with the floor. 3. Hold your knee in place by grabbing your knee or thigh with both hands and hold. 4. Pull on your knee until you feel a gentle stretch in your lower back or buttocks. 5. Hold the stretch for 10-30 seconds. 6. Slowly release and straighten your leg. Pelvic tilt Repeat these steps 5-10 times: 1. Lie on your back on a firm bed or the floor with your legs extended. 2. Bend your knees so they are pointing toward the ceiling and your feet are flat on the floor. 3. Tighten your lower abdominal muscles to press your lower back against the floor. This motion will tilt your pelvis so your tailbone points up toward the ceiling instead of pointing to your feet or the  floor. 4. With gentle tension  and even breathing, hold this position for 5-10 seconds. Cat-cow Repeat these steps until your lower back becomes more flexible: 1. Get into a hands-and-knees position on a firm surface. Keep your hands under your shoulders, and keep your knees under your hips. You may place padding under your knees for comfort. 2. Let your head hang down toward your chest. Contract your abdominal muscles and point your tailbone toward the floor so your lower back becomes rounded like the back of a cat. 3. Hold this position for 5 seconds. 4. Slowly lift your head, let your abdominal muscles relax and point your tailbone up toward the ceiling so your back forms a sagging arch like the back of a cow. 5. Hold this position for 5 seconds.  Press-ups Repeat these steps 5-10 times: 1. Lie on your abdomen (face-down) on the floor. 2. Place your palms near your head, about shoulder-width apart. 3. Keeping your back as relaxed as possible and keeping your hips on the floor, slowly straighten your arms to raise the top half of your body and lift your shoulders. Do not use your back muscles to raise your upper torso. You may adjust the placement of your hands to make yourself more comfortable. 4. Hold this position for 5 seconds while you keep your back relaxed. 5. Slowly return to lying flat on the floor.  Bridges Repeat these steps 10 times: 1. Lie on your back on a firm surface. 2. Bend your knees so they are pointing toward the ceiling and your feet are flat on the floor. Your arms should be flat at your sides, next to your body. 3. Tighten your buttocks muscles and lift your buttocks off the floor until your waist is at almost the same height as your knees. You should feel the muscles working in your buttocks and the back of your thighs. If you do not feel these muscles, slide your feet 1-2 inches farther away from your buttocks. 4. Hold this position for 3-5 seconds. 5. Slowly lower  your hips to the starting position, and allow your buttocks muscles to relax completely. If this exercise is too easy, try doing it with your arms crossed over your chest. Abdominal crunches Repeat these steps 5-10 times: 1. Lie on your back on a firm bed or the floor with your legs extended. 2. Bend your knees so they are pointing toward the ceiling and your feet are flat on the floor. 3. Cross your arms over your chest. 4. Tip your chin slightly toward your chest without bending your neck. 5. Tighten your abdominal muscles and slowly raise your trunk (torso) high enough to lift your shoulder blades a tiny bit off the floor. Avoid raising your torso higher than that because it can put too much stress on your low back and does not help to strengthen your abdominal muscles. 6. Slowly return to your starting position. Back lifts Repeat these steps 5-10 times: 1. Lie on your abdomen (face-down) with your arms at your sides, and rest your forehead on the floor. 2. Tighten the muscles in your legs and your buttocks. 3. Slowly lift your chest off the floor while you keep your hips pressed to the floor. Keep the back of your head in line with the curve in your back. Your eyes should be looking at the floor. 4. Hold this position for 3-5 seconds. 5. Slowly return to your starting position. Contact a health care provider if:  Your back pain or discomfort gets much worse when  you do an exercise.  Your worsening back pain or discomfort does not lessen within 2 hours after you exercise. If you have any of these problems, stop doing these exercises right away. Do not do them again unless your health care provider says that you can. Get help right away if:  You develop sudden, severe back pain. If this happens, stop doing the exercises right away. Do not do them again unless your health care provider says that you can. This information is not intended to replace advice given to you by your health care  provider. Make sure you discuss any questions you have with your health care provider. Document Revised: 10/31/2018 Document Reviewed: 03/28/2018 Elsevier Patient Education  [place].    Cholesterol Content in Foods Cholesterol is a waxy, fat-like substance that helps to carry fat in the blood. The body needs cholesterol in small amounts, but too much cholesterol can cause damage to the arteries and heart. Most people should eat less than 200 milligrams (mg) of cholesterol a day. Foods with cholesterol  Cholesterol is found in animal-based foods, such as meat, seafood, and dairy. Generally, low-fat dairy and lean meats have less cholesterol than full-fat dairy and fatty meats. The milligrams of cholesterol per serving (mg per serving) of common cholesterol-containing foods are listed below. Meat and other proteins  Egg -- one large whole egg has 186 mg.  Veal shank -- 4 oz has 141 mg.  Lean ground [place] (93% lean) -- 4 oz has 118 mg.  Fat-trimmed lamb loin -- 4 oz has 106 mg.  Lean ground beef (90% lean) -- 4 oz has 100 mg.  Lobster -- 3.5 oz has 90 mg.  Pork loin chops -- 4 oz has 86 mg.  Canned salmon -- 3.5 oz has 83 mg.  Fat-trimmed beef top loin -- 4 oz has 78 mg.  Frankfurter -- 1 frank (3.5 oz) has 77 mg.  Crab -- 3.5 oz has 71 mg.  Roasted chicken without skin, white meat -- 4 oz has 66 mg.  Light bologna -- 2 oz has 45 mg.  Deli-cut [place] -- 2 oz has 31 mg.  Canned tuna -- 3.5 oz has 31 mg.  Berniece Salines -- 1 oz has 29 mg.  Oysters and mussels (raw) -- 3.5 oz has 25 mg.  Mackerel -- 1 oz has 22 mg.  Trout -- 1 oz has 20 mg.  Pork sausage -- 1 link (1 oz) has 17 mg.  Salmon -- 1 oz has 16 mg.  Tilapia -- 1 oz has 14 mg. Dairy  Soft-serve ice cream --  cup (4 oz) has 103 mg.  Whole-milk yogurt -- 1 cup (8 oz) has 29 mg.  Cheddar cheese -- 1 oz has 28 mg.  American cheese -- 1 oz has 28 mg.  Whole milk -- 1 cup (8 oz) has 23 mg.  2%  milk -- 1 cup (8 oz) has 18 mg.  Cream cheese -- 1 tablespoon (Tbsp) has 15 mg.  Cottage cheese --  cup (4 oz) has 14 mg.  Low-fat (1%) milk -- 1 cup (8 oz) has 10 mg.  Sour cream -- 1 Tbsp has 8.5 mg.  Low-fat yogurt -- 1 cup (8 oz) has 8 mg.  Nonfat Greek yogurt -- 1 cup (8 oz) has 7 mg.  Half-and-half cream -- 1 Tbsp has 5 mg. Fats and oils  Cod liver oil -- 1 tablespoon (Tbsp) has 82 mg.  Butter -- 1 Tbsp has 15  mg.  Lard -- 1 Tbsp has 14 mg.  Bacon grease -- 1 Tbsp has 14 mg.  Mayonnaise -- 1 Tbsp has 5-10 mg.  Margarine -- 1 Tbsp has 3-10 mg. Exact amounts of cholesterol in these foods may vary depending on specific ingredients and brands. Foods without cholesterol Most plant-based foods do not have cholesterol unless you combine them with a food that has cholesterol. Foods without cholesterol include:  Grains and cereals.  Vegetables.  Fruits.  Vegetable oils, such as olive, canola, and sunflower oil.  Legumes, such as peas, beans, and lentils.  Nuts and seeds.  Egg whites. Summary  The body needs cholesterol in small amounts, but too much cholesterol can cause damage to the arteries and heart.  Most people should eat less than 200 milligrams (mg) of cholesterol a day. This information is not intended to replace advice given to you by your health care provider. Make sure you discuss any questions you have with your health care provider. Document Revised: 06/08/2017 Document Reviewed: 02/20/2017 Elsevier Patient Education  [place].    High Cholesterol  High cholesterol is a condition in which the blood has high levels of a white, waxy, fat-like substance (cholesterol). The human body needs small amounts of cholesterol. The liver makes all the cholesterol that the body needs. Extra (excess) cholesterol comes from the food that we eat. Cholesterol is carried from the liver by the blood through the blood vessels. If you have high cholesterol,  deposits (plaques) may build up on the walls of your blood vessels (arteries). Plaques make the arteries narrower and stiffer. Cholesterol plaques increase your risk for heart attack and stroke. Work with your health care provider to keep your cholesterol levels in a healthy range. What increases the risk? This condition is more likely to develop in people who:  Eat foods that are high in animal fat (saturated fat) or cholesterol.  Are overweight.  Are not getting enough exercise.  Have a family history of high cholesterol. What are the signs or symptoms? There are no symptoms of this condition. How is this diagnosed? This condition may be diagnosed from the results of a blood test.  If you are older than age 43, your health care provider may check your cholesterol every 4-6 years.  You may be checked more often if you already have high cholesterol or other risk factors for heart disease. The blood test for cholesterol measures:  "Bad" cholesterol (LDL cholesterol). This is the main type of cholesterol that causes heart disease. The desired level for LDL is less than 100.  "Good" cholesterol (HDL cholesterol). This type helps to protect against heart disease by cleaning the arteries and carrying the LDL away. The desired level for HDL is 60 or higher.  Triglycerides. These are fats that the body can store or burn for energy. The desired number for triglycerides is lower than 150.  Total cholesterol. This is a measure of the total amount of cholesterol in your blood, including LDL cholesterol, HDL cholesterol, and triglycerides. A healthy number is less than 200. How is this treated? This condition is treated with diet changes, lifestyle changes, and medicines. Diet changes  This may include eating more whole grains, fruits, vegetables, nuts, and fish.  This may also include cutting back on red meat and foods that have a lot of added sugar. Lifestyle changes  Changes may include  getting at least 40 minutes of aerobic exercise 3 times a week. Aerobic exercises include walking, biking,  and swimming. Aerobic exercise along with a healthy diet can help you maintain a healthy weight.  Changes may also include quitting smoking. Medicines  Medicines are usually given if diet and lifestyle changes have failed to reduce your cholesterol to healthy levels.  Your health care provider may prescribe a statin medicine. Statin medicines have been shown to reduce cholesterol, which can reduce the risk of heart disease. Follow these instructions at home: Eating and drinking If told by your health care provider:  Eat chicken (without skin), fish, veal, shellfish, ground [place] breast, and round or loin cuts of red meat.  Do not eat fried foods or fatty meats, such as hot dogs and salami.  Eat plenty of fruits, such as apples.  Eat plenty of vegetables, such as broccoli, potatoes, and carrots.  Eat beans, peas, and lentils.  Eat grains such as barley, rice, couscous, and bulgur wheat.  Eat pasta without cream sauces.  Use skim or nonfat milk, and eat low-fat or nonfat yogurt and cheeses.  Do not eat or drink whole milk, cream, ice cream, egg yolks, or hard cheeses.  Do not eat stick margarine or tub margarines that contain trans fats (also called partially hydrogenated oils).  Do not eat saturated tropical oils, such as coconut oil and palm oil.  Do not eat cakes, cookies, crackers, or other baked goods that contain trans fats.  General instructions  Exercise as directed by your health care provider. Increase your activity level with activities such as gardening, walking, and taking the stairs.  Take over-the-counter and prescription medicines only as told by your health care provider.  Do not use any products that contain nicotine or tobacco, such as cigarettes and e-cigarettes. If you need help quitting, ask your health care provider.  Keep all follow-up visits as  told by your health care provider. This is important. Contact a health care provider if:  You are struggling to maintain a healthy diet or weight.  You need help to start on an exercise program.  You need help to stop smoking. Get help right away if:  You have chest pain.  You have trouble breathing. This information is not intended to replace advice given to you by your health care provider. Make sure you discuss any questions you have with your health care provider. Document Revised: 06/29/2017 Document Reviewed: 12/25/2015 Elsevier Patient Education  [place].

## 2020-04-11 LAB — CYTOLOGY - PAP
Comment: NEGATIVE
Diagnosis: UNDETERMINED — AB
High risk HPV: NEGATIVE

## 2020-04-12 ENCOUNTER — Encounter: Payer: Self-pay | Admitting: Internal Medicine

## 2020-04-12 DIAGNOSIS — R87610 Atypical squamous cells of undetermined significance on cytologic smear of cervix (ASC-US): Secondary | ICD-10-CM | POA: Insufficient documentation

## 2020-04-26 ENCOUNTER — Other Ambulatory Visit: Payer: Self-pay | Admitting: Internal Medicine

## 2020-04-26 NOTE — Addendum Note (Signed)
Addended by: Orland Mustard on: 04/26/2020 08:40 AM   Modules accepted: Orders

## 2020-07-06 ENCOUNTER — Telehealth: Payer: Self-pay | Admitting: Internal Medicine

## 2020-10-12 ENCOUNTER — Other Ambulatory Visit (INDEPENDENT_AMBULATORY_CARE_PROVIDER_SITE_OTHER): Payer: Commercial Managed Care - PPO

## 2020-10-12 ENCOUNTER — Other Ambulatory Visit: Payer: Self-pay

## 2020-10-12 DIAGNOSIS — E876 Hypokalemia: Secondary | ICD-10-CM

## 2020-10-12 DIAGNOSIS — Z0000 Encounter for general adult medical examination without abnormal findings: Secondary | ICD-10-CM

## 2020-10-12 DIAGNOSIS — Z1329 Encounter for screening for other suspected endocrine disorder: Secondary | ICD-10-CM | POA: Diagnosis not present

## 2020-10-12 DIAGNOSIS — E785 Hyperlipidemia, unspecified: Secondary | ICD-10-CM

## 2020-10-12 LAB — CBC WITH DIFFERENTIAL/PLATELET
Basophils Absolute: 0.1 10*3/uL (ref 0.0–0.1)
Basophils Relative: 1.8 % (ref 0.0–3.0)
Eosinophils Absolute: 0.5 10*3/uL (ref 0.0–0.7)
Eosinophils Relative: 8.9 % — ABNORMAL HIGH (ref 0.0–5.0)
HCT: 37.6 % (ref 36.0–46.0)
Hemoglobin: 12.4 g/dL (ref 12.0–15.0)
Lymphocytes Relative: 33.3 % (ref 12.0–46.0)
Lymphs Abs: 1.9 10*3/uL (ref 0.7–4.0)
MCHC: 33.1 g/dL (ref 30.0–36.0)
MCV: 82.3 fl (ref 78.0–100.0)
Monocytes Absolute: 0.6 10*3/uL (ref 0.1–1.0)
Monocytes Relative: 10.5 % (ref 3.0–12.0)
Neutro Abs: 2.6 10*3/uL (ref 1.4–7.7)
Neutrophils Relative %: 45.5 % (ref 43.0–77.0)
Platelets: 335 10*3/uL (ref 150.0–400.0)
RBC: 4.57 Mil/uL (ref 3.87–5.11)
RDW: 16.6 % — ABNORMAL HIGH (ref 11.5–15.5)
WBC: 5.6 10*3/uL (ref 4.0–10.5)

## 2020-10-12 LAB — URINALYSIS, ROUTINE W REFLEX MICROSCOPIC
Bilirubin Urine: NEGATIVE
Ketones, ur: NEGATIVE
Leukocytes,Ua: NEGATIVE
Nitrite: NEGATIVE
Specific Gravity, Urine: 1.01 (ref 1.000–1.030)
Total Protein, Urine: NEGATIVE
Urine Glucose: NEGATIVE
Urobilinogen, UA: 0.2 (ref 0.0–1.0)
pH: 8.5 — AB (ref 5.0–8.0)

## 2020-10-12 LAB — COMPREHENSIVE METABOLIC PANEL
ALT: 21 U/L (ref 0–35)
AST: 25 U/L (ref 0–37)
Albumin: 4 g/dL (ref 3.5–5.2)
Alkaline Phosphatase: 88 U/L (ref 39–117)
BUN: 11 mg/dL (ref 6–23)
CO2: 32 mEq/L (ref 19–32)
Calcium: 9.6 mg/dL (ref 8.4–10.5)
Chloride: 101 mEq/L (ref 96–112)
Creatinine, Ser: 0.76 mg/dL (ref 0.40–1.20)
GFR: 89.51 mL/min (ref >60.00)
Glucose, Bld: 81 mg/dL (ref 70–99)
Potassium: 3.6 mEq/L (ref 3.5–5.1)
Sodium: 141 mEq/L (ref 135–145)
Total Bilirubin: 0.3 mg/dL (ref 0.2–1.2)
Total Protein: 6.6 g/dL (ref 6.0–8.3)

## 2020-10-12 LAB — MAGNESIUM: Magnesium: 1.6 mg/dL (ref 1.5–2.5)

## 2020-10-12 LAB — LIPID PANEL
Cholesterol: 219 mg/dL — ABNORMAL HIGH (ref 0–200)
HDL: 79.6 mg/dL (ref >39.00)
LDL Cholesterol: 123 mg/dL — ABNORMAL HIGH (ref 0–99)
NonHDL: 139.34
Total CHOL/HDL Ratio: 3
Triglycerides: 81 mg/dL (ref 0.0–149.0)
VLDL: 16.2 mg/dL (ref 0.0–40.0)

## 2020-10-12 LAB — TSH: TSH: 2.29 u[IU]/mL (ref 0.35–4.50)

## 2020-12-14 ENCOUNTER — Other Ambulatory Visit: Payer: Self-pay | Admitting: Internal Medicine

## 2020-12-14 DIAGNOSIS — F419 Anxiety disorder, unspecified: Secondary | ICD-10-CM

## 2020-12-14 DIAGNOSIS — F339 Major depressive disorder, recurrent, unspecified: Secondary | ICD-10-CM

## 2021-03-04 ENCOUNTER — Other Ambulatory Visit: Payer: Self-pay | Admitting: Otolaryngology

## 2021-03-04 DIAGNOSIS — H9041 Sensorineural hearing loss, unilateral, right ear, with unrestricted hearing on the contralateral side: Secondary | ICD-10-CM

## 2021-03-22 ENCOUNTER — Other Ambulatory Visit: Payer: Self-pay

## 2021-03-22 ENCOUNTER — Ambulatory Visit
Admission: RE | Admit: 2021-03-22 | Discharge: 2021-03-22 | Disposition: A | Discharge status: Home / Self Care | Payer: Commercial Managed Care - PPO | Source: Ambulatory Visit | Attending: Otolaryngology | Admitting: Otolaryngology

## 2021-03-22 DIAGNOSIS — H9041 Sensorineural hearing loss, unilateral, right ear, with unrestricted hearing on the contralateral side: Secondary | ICD-10-CM | POA: Diagnosis present

## 2021-03-22 MED ORDER — GADOBUTROL 1 MMOL/ML IV SOLN
7.0000 mL | Freq: Once | INTRAVENOUS | Status: AC | PRN
  Administered 2021-03-22: 7 mL via INTRAVENOUS

## 2021-04-12 ENCOUNTER — Encounter: Payer: Commercial Managed Care - PPO | Admitting: Internal Medicine

## 2021-05-05 ENCOUNTER — Ambulatory Visit (INDEPENDENT_AMBULATORY_CARE_PROVIDER_SITE_OTHER): Payer: Commercial Managed Care - PPO | Admitting: Internal Medicine

## 2021-05-05 ENCOUNTER — Other Ambulatory Visit: Payer: Self-pay

## 2021-05-05 ENCOUNTER — Encounter: Payer: Self-pay | Admitting: Internal Medicine

## 2021-05-05 VITALS — BP 112/72 | HR 71 | Temp 97.5°F | Ht 63.31 in | Wt 142.8 lb

## 2021-05-05 DIAGNOSIS — Z1211 Encounter for screening for malignant neoplasm of colon: Secondary | ICD-10-CM | POA: Diagnosis not present

## 2021-05-05 DIAGNOSIS — G9389 Other specified disorders of brain: Secondary | ICD-10-CM | POA: Diagnosis not present

## 2021-05-05 DIAGNOSIS — Z0000 Encounter for general adult medical examination without abnormal findings: Secondary | ICD-10-CM | POA: Diagnosis not present

## 2021-05-05 DIAGNOSIS — D3610 Benign neoplasm of peripheral nerves and autonomic nervous system, unspecified: Secondary | ICD-10-CM | POA: Insufficient documentation

## 2021-05-05 DIAGNOSIS — Z23 Encounter for immunization: Secondary | ICD-10-CM

## 2021-05-05 DIAGNOSIS — Z1231 Encounter for screening mammogram for malignant neoplasm of breast: Secondary | ICD-10-CM

## 2021-05-05 NOTE — Patient Instructions (Signed)
Dr. Alice Reichert [facility] will call you   Primary Contact Information  Phone Fax E-mail Address  [phone] Not available [address]   [place] [address]   Tdap (Tetanus, Diphtheria, Pertussis) Vaccine: What You Need to Know 1. Why get vaccinated? Tdap vaccine can prevent tetanus, diphtheria, and pertussis. Diphtheria and pertussis spread from person to person. Tetanus enters the body through cuts or wounds. TETANUS (T) causes painful stiffening of the muscles. Tetanus can lead to serious health problems, including being unable to open the mouth, having trouble swallowing and breathing, or death. DIPHTHERIA (D) can lead to difficulty breathing, heart failure, paralysis, or death. PERTUSSIS (aP), also known as "whooping cough," can cause uncontrollable, violent coughing that makes it hard to breathe, eat, or drink. Pertussis can be extremely serious especially in babies and young children, causing pneumonia, convulsions, brain damage, or death. In teens and adults, it can cause weight loss, loss of bladder control, passing out, and rib fractures from severe coughing. 2. Tdap vaccine Tdap is only for children 7 years and older, adolescents, and adults.  Adolescents should receive a single dose of Tdap, preferably at age 18 or 46 years. Pregnant people should get a dose of Tdap during every pregnancy, preferably during the early part of the third trimester, to help protect the newborn from pertussis. Infants are most at risk for severe, life-threatening complications from pertussis. Adults who have never received Tdap should get a dose of Tdap. Also, adults should receive a booster dose of either Tdap or Td (a different vaccine that protects against tetanus and diphtheria but not pertussis) every 10 years, or after 5 years in the case of a severe or dirty wound or burn. Tdap may be given at the same time as other vaccines. 3. Talk with your health care provider Tell your vaccine  provider if the person getting the vaccine: Has had an allergic reaction after a previous dose of any vaccine that protects against tetanus, diphtheria, or pertussis, or has any severe, life-threatening allergies Has had a coma, decreased level of consciousness, or prolonged seizures within 7 days after a previous dose of any pertussis vaccine (DTP, DTaP, or Tdap) Has seizures or another nervous system problem Has ever had Guillain-Barr Syndrome (also called "GBS") Has had severe pain or swelling after a previous dose of any vaccine that protects against tetanus or diphtheria In some cases, your health care provider may decide to postpone Tdap vaccination until a future visit. People with minor illnesses, such as a cold, may be vaccinated. People who are moderately or severely ill should usually wait until they recover before getting Tdap vaccine.  Your health care provider can give you more information. 4. Risks of a vaccine reaction Pain, redness, or swelling where the shot was given, mild fever, headache, feeling tired, and nausea, vomiting, diarrhea, or stomachache sometimes happen after Tdap vaccination. People sometimes faint after medical procedures, including vaccination. Tell your provider if you feel dizzy or have vision changes or ringing in the ears.  As with any medicine, there is a very remote chance of a vaccine causing a severe allergic reaction, other serious injury, or death. 5. What if there is a serious problem? An allergic reaction could occur after the vaccinated person leaves the clinic. If you see signs of a severe allergic reaction (hives, swelling of the face and throat, difficulty breathing, a fast heartbeat, dizziness, or weakness), call 9-1-1 and get the person to the nearest hospital. For other signs that  concern you, call your health care provider.  Adverse reactions should be reported to the Vaccine Adverse Event Reporting System (VAERS). Your health care provider  will usually file this report, or you can do it yourself. Visit the VAERS website at www.vaers.SamedayNews.es or call 859 250 3447. VAERS is only for reporting reactions, and VAERS staff members do not give medical advice. 6. The National Vaccine Injury Compensation Program The [employer] Vaccine Injury Compensation Program (VICP) is a federal program that was created to compensate people who may have been injured by certain vaccines. Claims regarding alleged injury or death due to vaccination have a time limit for filing, which may be as short as two years. Visit the VICP website at GoldCloset.com.ee or call 518-531-9829 to learn about the program and about filing a claim. 7. How can I learn more? Ask your health care provider. Call your local or state health department. Visit the website of the Food and Drug Administration (FDA) for vaccine package inserts and additional information at TraderRating.uy. Contact the Centers for Disease Control and Prevention (CDC): Call 559-814-1368 (1-800-CDC-INFO) or Visit CDC's website at http://hunter.com/. Vaccine Information Statement Tdap (Tetanus, Diphtheria, Pertussis) Vaccine (02/13/2020) This information is not intended to replace advice given to you by your health care provider. Make sure you discuss any questions you have with your health care provider. Document Revised: 03/10/2020 Document Reviewed: 03/10/2020 Elsevier Patient Education  2022 [employer].

## 2021-05-05 NOTE — Progress Notes (Signed)
Chief Complaint  Patient presents with   Annual Exam   Annual  1. 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 Antonieta Loveless was having dizziness and right ear hearing loss  2. Tdap will get today  3. Pap with h/o [facility] sch 08/22/21 f/u  Review of Systems  Constitutional:  Negative for weight loss.  HENT:  Positive for hearing loss.   Eyes:  Negative for blurred vision.  Respiratory:  Negative for shortness of breath.   Cardiovascular:  Negative for chest pain.  Gastrointestinal:  Negative for abdominal pain.  Musculoskeletal:  Negative for falls and joint pain.  Skin:  Negative for rash.  Neurological:  Positive for dizziness. Negative for headaches.  Psychiatric/Behavioral:  Negative for depression.   Past Medical History:  Diagnosis Date   Anxiety    Past Surgical History:  Procedure Laterality Date   TUBAL LIGATION     Family History  Problem Relation Age of Onset   Diabetes Father    Social History   Socioeconomic History   Marital status: Married    Spouse name: Not on file   Number of children: Not on file   Years of education: Not on file   Highest education level: Not on file  Occupational History   Not on file  Tobacco Use   Smoking status: Former   Smokeless tobacco: Never  Vaping Use   Vaping Use: Never used  Substance and Sexual Activity   Alcohol use: Yes    Comment: socially   Drug use: No   Sexual activity: Yes    Birth control/protection: None  Other Topics Concern   Not on file  Social History Narrative   3 kids    1 is [place] also Dr. [employer] patient    Former smoker quit in 2014 smoked x 35 years 1 ppd    Social Determinants of Radio broadcast assistant Strain: Not on file  Food Insecurity: Not on file  Transportation Needs: Not on file  Physical Activity: Not on file  Stress: Not on file  Social Connections: Not on file  Intimate Partner Violence: Not on file   Current Meds  Medication Sig    citalopram (CELEXA) 20 MG tablet TAKE 1 TABLET BY MOUTH EVERY DAY   hydrocortisone 2.5 % cream Apply topically 2 (two) times daily. Prn face and right upper arm   Allergies  Allergen Reactions   Penicillins Nausea And Vomiting   No results found for this or any previous visit (from the past 2160 hour(s)). Objective  Body mass index is 25.05 kg/m. Wt Readings from Last 3 Encounters:  05/05/21 142 lb 12.8 oz (64.8 kg)  04/08/20 157 lb (71.2 kg)  10/07/19 153 lb 9.6 oz (69.7 kg)   Temp Readings from Last 3 Encounters:  05/05/21 (!) 97.5 F (36.4 C) (Oral)  04/08/20 97.8 F (36.6 C) (Oral)  10/07/19 98 F (36.7 C) (Temporal)   BP Readings from Last 3 Encounters:  05/05/21 112/72  04/08/20 98/64  10/07/19 110/74   Pulse Readings from Last 3 Encounters:  05/05/21 71  04/08/20 86  10/07/19 73    Physical Exam Vitals and nursing note reviewed.  Constitutional:      Appearance: Normal appearance. She is well-developed and well-groomed.  HENT:     Head: Normocephalic and atraumatic.  Eyes:     Conjunctiva/sclera: Conjunctivae normal.     Pupils: Pupils are equal, round, and reactive to light.  Cardiovascular:     Rate and Rhythm: Normal rate and regular rhythm.     Heart sounds: Normal heart sounds. No murmur heard. Chest:     Chest wall: No mass.  Breasts:    Breasts are symmetrical.     Right: Normal.     Left: Normal.  Abdominal:     Tenderness: There is no abdominal tenderness.  Lymphadenopathy:     Upper Body:     Right upper body: No axillary adenopathy.     Left upper body: No axillary adenopathy.  Skin:    General: Skin is warm and dry.  Neurological:     General: No focal deficit present.     Mental Status: She is alert and oriented to person, place, and time. Mental status is at baseline.     Gait: Gait normal.  Psychiatric:        Attention and Perception: Attention and perception normal.        Mood and Affect: Mood and affect normal.         Speech: Speech normal.        Behavior: Behavior normal. Behavior is cooperative.        Thought Content: Thought content normal.        Cognition and Memory: Cognition and memory normal.        Judgment: Judgment normal.    Assessment  Plan  Annual physical exam - Plan: Comprehensive metabolic panel, Lipid panel, CBC w/Diff, TSH, Urinalysis, Routine w reflex microscopic, Hepatitis C antibody Fasting labs 10/12/21 Flu shot declines Tdap today shingrix declines covid 2/2 +abx 500s per pt as of 04/08/20, declines   mammo referred [place] today   [facility]ap [facility] ? 2018 try to get record as of 06/23/19 no records  -pap9/2021 [facility] f/u [facility] ob/gyn 08/2021 sch appt   colonoscopy [facility] referred wants by 06/2021   severe hemorrhoids x 1 year with clear mucous leakage daily and brbpr S/p banding x 3 hemorrhoids less protruding with BM Dr. 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 Earnest Conroy 05/10/21  Cleared medically low revised risk score   Provider: Dr. Olivia Mackie [name]-Internal Medicine

## 2021-05-10 DIAGNOSIS — D333 Benign neoplasm of cranial nerves: Secondary | ICD-10-CM | POA: Insufficient documentation

## 2021-08-12 ENCOUNTER — Encounter: Payer: Self-pay | Admitting: Internal Medicine

## 2021-08-12 ENCOUNTER — Other Ambulatory Visit: Payer: Self-pay | Admitting: Internal Medicine

## 2021-08-12 DIAGNOSIS — E2839 Other primary ovarian failure: Secondary | ICD-10-CM

## 2021-08-12 NOTE — Telephone Encounter (Signed)
Okay for bone density or does Patient need to be seen?

## 2021-08-26 ENCOUNTER — Telehealth: Payer: Self-pay | Admitting: Internal Medicine

## 2021-08-26 NOTE — Telephone Encounter (Signed)
Called and spoke with the Patient. She was scheduled to come in 09/06/21 by front desk but then informed them she was told by access nurse to be seen in the next four hours due to concern for a clot.   Spoke with the Patient and states the bilateral thigh pain has been ongoing since November with no improvement. States the thighs are not swollen or discolored. 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 States she is okay to wait to be seen until next week.   Moved Patient's appointment up from 2 weeks out to next week, Tuesday 08/30/21 with Dr Derrel Nip. Dr Olivia Mackie [name] had no open slots for next week. Patient informed to be seen urgently should anything change.

## 2021-08-26 NOTE — Telephone Encounter (Signed)
Pt called in stating she has severe pain in thighs when she sits for a long time. Pt stated that its like they are full of led and achy. Pt stated this has been going on for 3 or 4 weeks and is getting worse. Sent to access nurse

## 2021-08-26 NOTE — Telephone Encounter (Signed)
Noted  

## 2021-08-26 NOTE — Telephone Encounter (Signed)
Thank you if I have open slot move to my schedule or ok to see Dr. Derrel Nip as well  Thank you both

## 2021-08-30 ENCOUNTER — Ambulatory Visit
Admission: RE | Admit: 2021-08-30 | Discharge: 2021-08-30 | Disposition: A | Discharge status: Home / Self Care | Payer: Commercial Managed Care - PPO | Source: Ambulatory Visit | Attending: Internal Medicine | Admitting: Internal Medicine

## 2021-08-30 ENCOUNTER — Other Ambulatory Visit: Payer: Self-pay

## 2021-08-30 ENCOUNTER — Encounter: Payer: Self-pay | Admitting: Internal Medicine

## 2021-08-30 ENCOUNTER — Ambulatory Visit
Admission: RE | Admit: 2021-08-30 | Discharge: 2021-08-30 | Disposition: A | Discharge status: Home / Self Care | Payer: Commercial Managed Care - PPO | Attending: Internal Medicine | Admitting: Internal Medicine

## 2021-08-30 ENCOUNTER — Ambulatory Visit (INDEPENDENT_AMBULATORY_CARE_PROVIDER_SITE_OTHER): Payer: Commercial Managed Care - PPO | Admitting: Internal Medicine

## 2021-08-30 VITALS — BP 108/76 | HR 83 | Temp 97.8°F | Ht 63.0 in | Wt 140.4 lb

## 2021-08-30 DIAGNOSIS — M6281 Muscle weakness (generalized): Secondary | ICD-10-CM

## 2021-08-30 DIAGNOSIS — R29898 Other symptoms and signs involving the musculoskeletal system: Secondary | ICD-10-CM | POA: Diagnosis present

## 2021-08-30 DIAGNOSIS — D3610 Benign neoplasm of peripheral nerves and autonomic nervous system, unspecified: Secondary | ICD-10-CM

## 2021-08-30 DIAGNOSIS — M25552 Pain in left hip: Secondary | ICD-10-CM | POA: Diagnosis present

## 2021-08-30 DIAGNOSIS — E538 Deficiency of other specified B group vitamins: Secondary | ICD-10-CM

## 2021-08-30 HISTORY — DX: Other symptoms and signs involving the musculoskeletal system: R29.898

## 2021-08-30 LAB — CBC WITH DIFFERENTIAL/PLATELET
Basophils Absolute: 0.1 10*3/uL (ref 0.0–0.1)
Basophils Relative: 1.2 % (ref 0.0–3.0)
Eosinophils Absolute: 0.5 10*3/uL (ref 0.0–0.7)
Eosinophils Relative: 7.9 % — ABNORMAL HIGH (ref 0.0–5.0)
HCT: 42.1 % (ref 36.0–46.0)
Hemoglobin: 13.7 g/dL (ref 12.0–15.0)
Lymphocytes Relative: 30.5 % (ref 12.0–46.0)
Lymphs Abs: 1.7 10*3/uL (ref 0.7–4.0)
MCHC: 32.5 g/dL (ref 30.0–36.0)
MCV: 86.5 fl (ref 78.0–100.0)
Monocytes Absolute: 0.5 10*3/uL (ref 0.1–1.0)
Monocytes Relative: 9.5 % (ref 3.0–12.0)
Neutro Abs: 2.9 10*3/uL (ref 1.4–7.7)
Neutrophils Relative %: 50.9 % (ref 43.0–77.0)
Platelets: 345 10*3/uL (ref 150.0–400.0)
RBC: 4.87 Mil/uL (ref 3.87–5.11)
RDW: 14 % (ref 11.5–15.5)
WBC: 5.7 10*3/uL (ref 4.0–10.5)

## 2021-08-30 LAB — SEDIMENTATION RATE: Sed Rate: 17 mm/hr (ref 0–30)

## 2021-08-30 LAB — VITAMIN B12: Vitamin B-12: 272 pg/mL (ref 211–911)

## 2021-08-30 LAB — CK: Total CK: 70 U/L (ref 7–177)

## 2021-08-30 LAB — C-REACTIVE PROTEIN: CRP: <1 mg/dL (ref 0.5–20.0)

## 2021-08-30 NOTE — Assessment & Plan Note (Signed)
Etiology unclear. Exam is notable for left thigh weakness >> right,  And hyperreflexia bilaterally,  But left > right.  Plain films  Of left hip (right was normal in 2018) and labs to rule out inflammation/muscle injury ordered.  Advised to follow up with neurologist at [facility] to rule out neuropathy

## 2021-08-30 NOTE — Addendum Note (Signed)
Addended by: Crecencio Mc on: 08/30/2021 02:03 PM   Modules accepted: Orders

## 2021-08-30 NOTE — Progress Notes (Signed)
Subjective:  Patient ID: Margaret Blake, female    DOB: 11/20/1965  Age: 56 y.o. MRN: [id number]  CC: The primary encounter diagnosis was Muscle weakness of lower extremity. Diagnoses of Left hip pain and Weakness of both lower extremities were also pertinent to this visit.   This visit occurred during the SARS-CoV-2 public health emergency.  Safety protocols were in place, including screening questions prior to the visit, additional usage of staff PPE, and extensive cleaning of exam room while observing appropriate contact time as indicated for disinfecting solutions.    HPI SHAIDA ROUTE presents for  Chief Complaint  Patient presents with   Acute Visit    Bilateral thigh pain since end of last year. Pt stated that she had brain surgery in November 2022 and just from recovering from surgery but it has continued to get worse.    56 yr old female with history of Right sided acoustic schwannoma  Ipresented with hearing loss,  loss of balance)  s/p  resection on Nov 1 using transtemporal approach,  follow up MRI done on Dec 6 noting the following,  presents with bilateral thigh pain and proximal leg weakness of several months duration :   IMPRESSION:   1. Right retromastoid skin defect with increased edema and enhancement of  subadjacent soft tissues, as well as focus of susceptibility which may  represent blood products and/or air. Findings are nonspecific but may  represent soft tissue infection. No evidence of intracranial extension.  Subadjacent dural thickening and enhancement is unchanged from prior and  likely postoperative.   2. 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   3. Two small areas of residual enhancement at the ostium of the right IAC  ostium and fundus are unchanged and may represent post-surgical changes  versus residual tumor.   She states that both thighs felt weak prior  to her neurosurgery .  There was No history of over use or of   regular exercise. No history of spinal stenosis. Prior to the surgery,  her  right leg , which is weaker than the left,  would spontaneously contract on its own while at rest(she states that the leg would "cross over" without her even being aware of it. This phenomena has stopped,  but the bilateral thigh pain and weakness have gotten worse over the last 6 weeks and she often has to lift her leg to go up one step at home.  She Works as an Therapist, sports,  had a 6 week post op complicated by wound healing  so did not resume patient care for an additional 3 weeks.  No fevers,  headaches.   No low back pain .    Has had restoration of balance but the hearing loss has been complete on the right     Rigth calf was imaged with [place] post operatively due to pain,  DVT ruled out History of right hip pain  in 2018,  plain films normal following the fall.    Outpatient Medications Prior to Visit  Medication Sig Dispense Refill   citalopram (CELEXA) 20 MG tablet TAKE 1 TABLET BY MOUTH EVERY DAY 90 tablet 3   hydrocortisone 2.5 % cream Apply topically 2 (two) times daily. Prn face and right upper arm 60 g 11   Outpatient Medications Prior to Visit  Medication Sig Dispense Refill   citalopram (CELEXA) 20 MG tablet TAKE 1 TABLET BY MOUTH EVERY DAY 90 tablet 3   hydrocortisone 2.5 % cream  Apply topically 2 (two) times daily. Prn face and right upper arm 60 g 11   No facility-administered medications prior to visit.    Review of Systems;  Patient denies headache, fevers, malaise, unintentional weight loss, skin rash, eye pain, sinus congestion and sinus pain, sore throat, dysphagia,  hemoptysis , cough, dyspnea, wheezing, chest pain, palpitations, orthopnea, edema, abdominal pain, nausea, melena, diarrhea, constipation, flank pain, dysuria, hematuria, urinary  Frequency, nocturia, numbness, tingling, seizures,  Focal weakness, Loss of consciousness,  Tremor, insomnia, depression, anxiety, and suicidal ideation.       Objective:  BP 108/76 (BP Location: Left Arm, Patient Position: Sitting, Cuff Size: Normal)    Pulse 83    Temp 97.8 F (36.6 C) (Oral)    Ht 5\' 3"  (1.6 m)    Wt 140 lb 6.4 oz (63.7 kg)    LMP  (LMP Unknown)    SpO2 97%    BMI 24.87 kg/m   BP Readings from Last 3 Encounters:  08/30/21 108/76  05/05/21 112/72  04/08/20 98/64    Wt Readings from Last 3 Encounters:  08/30/21 140 lb 6.4 oz (63.7 kg)  05/05/21 142 lb 12.8 oz (64.8 kg)  04/08/20 157 lb (71.2 kg)    General appearance: alert, cooperative and appears stated age Ears: normal TM's and external ear canals both ears Throat: lips, mucosa, and tongue normal; teeth and gums normal Neck: no adenopathy, no carotid bruit, supple, symmetrical, trachea midline and thyroid not enlarged, symmetric, no tenderness/mass/nodules Back: symmetric, no curvature. ROM normal. No CVA tenderness. Lungs: clear to auscultation bilaterally Heart: regular rate and rhythm, S1, S2 normal, no murmur, click, rub or gallop Abdomen: soft, non-tender; bowel sounds normal; no masses,  no organomegaly Pulses: 2+ and symmetric Skin: Skin color, texture, turgor normal. No rashes or lesions Lymph nodes: Cervical, supraclavicular, and axillary nodes normal. Neuro: CNs 2-12 intact. DTRs 2+/4 in biceps, brachioradialis, but  3+/4 patellars and achilles. Muscle strength 5/5 in upper and lower distal extremities. Can toe raise .   Anterior thigh pain elicited with forced flexion>>forced extension.  Left quads 4/5,  right. 4+/5    cerebellar function normal. Romberg negative.  No pronator drift.   Gait normal.      No results found for: HGBA1C  Lab Results  Component Value Date   CREATININE 0.76 10/12/2020   CREATININE 0.94 10/07/2019   CREATININE 0.75 10/01/2018    Lab Results  Component Value Date   WBC 5.6 10/12/2020   HGB 12.4 10/12/2020   HCT 37.6 10/12/2020   PLT 335.0 10/12/2020   GLUCOSE 81 10/12/2020   CHOL 219 (H) 10/12/2020   TRIG 81.0  10/12/2020   HDL 79.60 10/12/2020   LDLCALC 123 (H) 10/12/2020   ALT 21 10/12/2020   AST 25 10/12/2020   NA 141 10/12/2020   K 3.6 10/12/2020   CL 101 10/12/2020   CREATININE 0.76 10/12/2020   BUN 11 10/12/2020   CO2 32 10/12/2020   TSH 2.29 10/12/2020    MR BRAIN/IAC W WO CONTRAST  Result Date: 03/22/2021 CLINICAL DATA:  Right-sided hearing loss.  Dizziness. EXAM: MRI HEAD WITHOUT AND WITH CONTRAST TECHNIQUE: Multiplanar, multiecho pulse sequences of the brain and surrounding structures were obtained without and with intravenous contrast. CONTRAST:  68mL GADAVIST GADOBUTROL 1 MMOL/ML IV SOLN COMPARISON:  None. FINDINGS: Brain: Extra-axial mass lesion centered in the right cerebellopontine angle cistern measuring approximately 3.9 x 2.8 x 2.7 cm with mass effect on the brainstem, right cerebellar hemisphere, middle  cerebellar peduncle and fourth ventricle without evidence of hydrocephalus. The lesion enhances after intravenous administration contrast with tiny extension into the right internal auditory canal and foci of susceptibility artifact which may be related to calcification versus blood products. The cisternal segment of the right seventh and eighth cranial nerves can not be distinguished from the lesion. No acute infarction, hemorrhage, or extra-axial collection. Vascular: Normal flow voids. Skull and upper cervical spine: Normal marrow signal. Sinuses/Orbits: Negative. Other: None. IMPRESSION: A right cerebellopontine angle cistern extra-axial mass lesion measuring up to 3.9 cm, may represent a meningioma versus schwannoma. There is significant mass effect on the brainstem, cerebellum and fourth ventricle without hydrocephalus. These results will be called to the ordering clinician or representative by the Radiologist Assistant, and communication documented in the PACS or [employer]. Electronically Signed   By: Pedro Earls M.D.   On: 03/22/2021 16:18    Assessment &  Plan:   Problem List Items Addressed This Visit     Lower extremity weakness    Etiology unclear. Exam is notable for left thigh weakness >> right,  And hyperreflexia bilaterally,  But left > right.  Plain films  Of left hip (right was normal in 2018) and labs to rule out inflammation/muscle injury ordered.  Advised to follow up with neurologist at [facility] to rule out neuropathy       Other Visit Diagnoses     Muscle weakness of lower extremity    -  Primary   Relevant Orders   Sedimentation rate   C-reactive protein   CK (Creatine Kinase)   CBC with Differential/Platelet   Vitamin B12   RBC Folate   Left hip pain       Relevant Orders   DG Hip Unilat W OR W/O Pelvis 2-3 Views Right       I spent 30 minutes dedicated to the care of this patient on the date of this encounter to include pre-visit review of patient's medical history,  most recent imaging studies, Face-to-face time with the patient , and post visit ordering of testing and therapeutics.    Follow-up: No follow-ups on file.   Crecencio Mc, MD

## 2021-08-30 NOTE — Patient Instructions (Addendum)
I recommend you schedule an appt with Dr Francesca Oman for nerve conduction studies   The labs I am doing today are to rule out infection and inflammation

## 2021-08-30 NOTE — Assessment & Plan Note (Signed)
S/p temporal approach resection at [facility] Nov 2022. Residual mass suspected on repeat MRI done in Dec at [facility]

## 2021-08-31 ENCOUNTER — Encounter: Payer: Self-pay | Admitting: Internal Medicine

## 2021-09-01 ENCOUNTER — Encounter: Payer: Self-pay | Admitting: Internal Medicine

## 2021-09-01 DIAGNOSIS — E538 Deficiency of other specified B group vitamins: Secondary | ICD-10-CM | POA: Insufficient documentation

## 2021-09-01 LAB — FOLATE RBC: RBC Folate: 746 ng/mL RBC (ref >280)

## 2021-09-01 NOTE — Assessment & Plan Note (Signed)
Mild,  Recommend oral supplementation 2500 mcg daily and recheck at next follow up visit

## 2021-09-01 NOTE — Assessment & Plan Note (Signed)
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  Orthopedics referral offered

## 2021-09-06 ENCOUNTER — Ambulatory Visit: Payer: Commercial Managed Care - PPO | Admitting: Internal Medicine

## 2021-10-11 ENCOUNTER — Other Ambulatory Visit: Payer: Commercial Managed Care - PPO

## 2021-10-24 LAB — HM COLONOSCOPY

## 2021-11-16 ENCOUNTER — Encounter: Payer: Self-pay | Admitting: Internal Medicine

## 2022-01-19 ENCOUNTER — Other Ambulatory Visit: Payer: Self-pay | Admitting: Internal Medicine

## 2022-01-19 DIAGNOSIS — F339 Major depressive disorder, recurrent, unspecified: Secondary | ICD-10-CM

## 2022-01-19 DIAGNOSIS — F419 Anxiety disorder, unspecified: Secondary | ICD-10-CM

## 2022-05-09 ENCOUNTER — Encounter: Payer: Commercial Managed Care - PPO | Admitting: Internal Medicine

## 2022-07-03 IMAGING — CR DG HIP (WITH OR WITHOUT PELVIS) 2-3V*L*
1 series · 3 of 3 positions shown · non-contrast
Comparison: Radiograph 02/23/2017

CLINICAL DATA: Muscle weakness of lower extremity.  Left hip pain.

EXAM:
DG HIP (WITH OR WITHOUT PELVIS) 2-3V LEFT

[Series 1: dg hip unilat w or w/o pelvis 2-3 views  · non-contrast · 0.14mm/px · 3 of 3 slices shown]
[im 1/3]
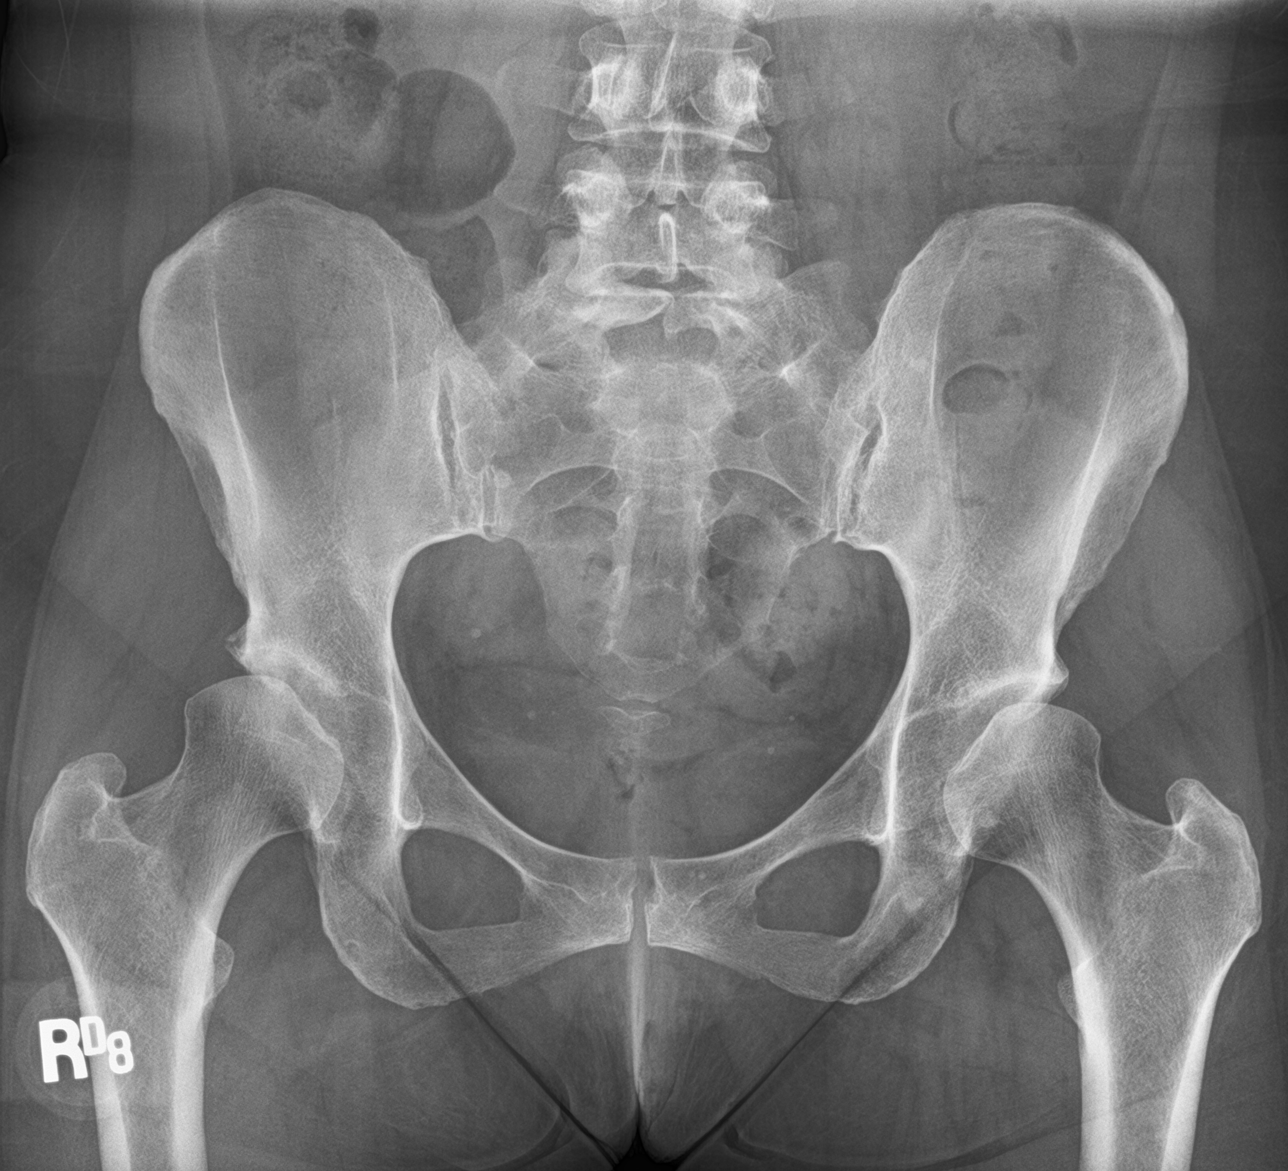
[im 2/3]
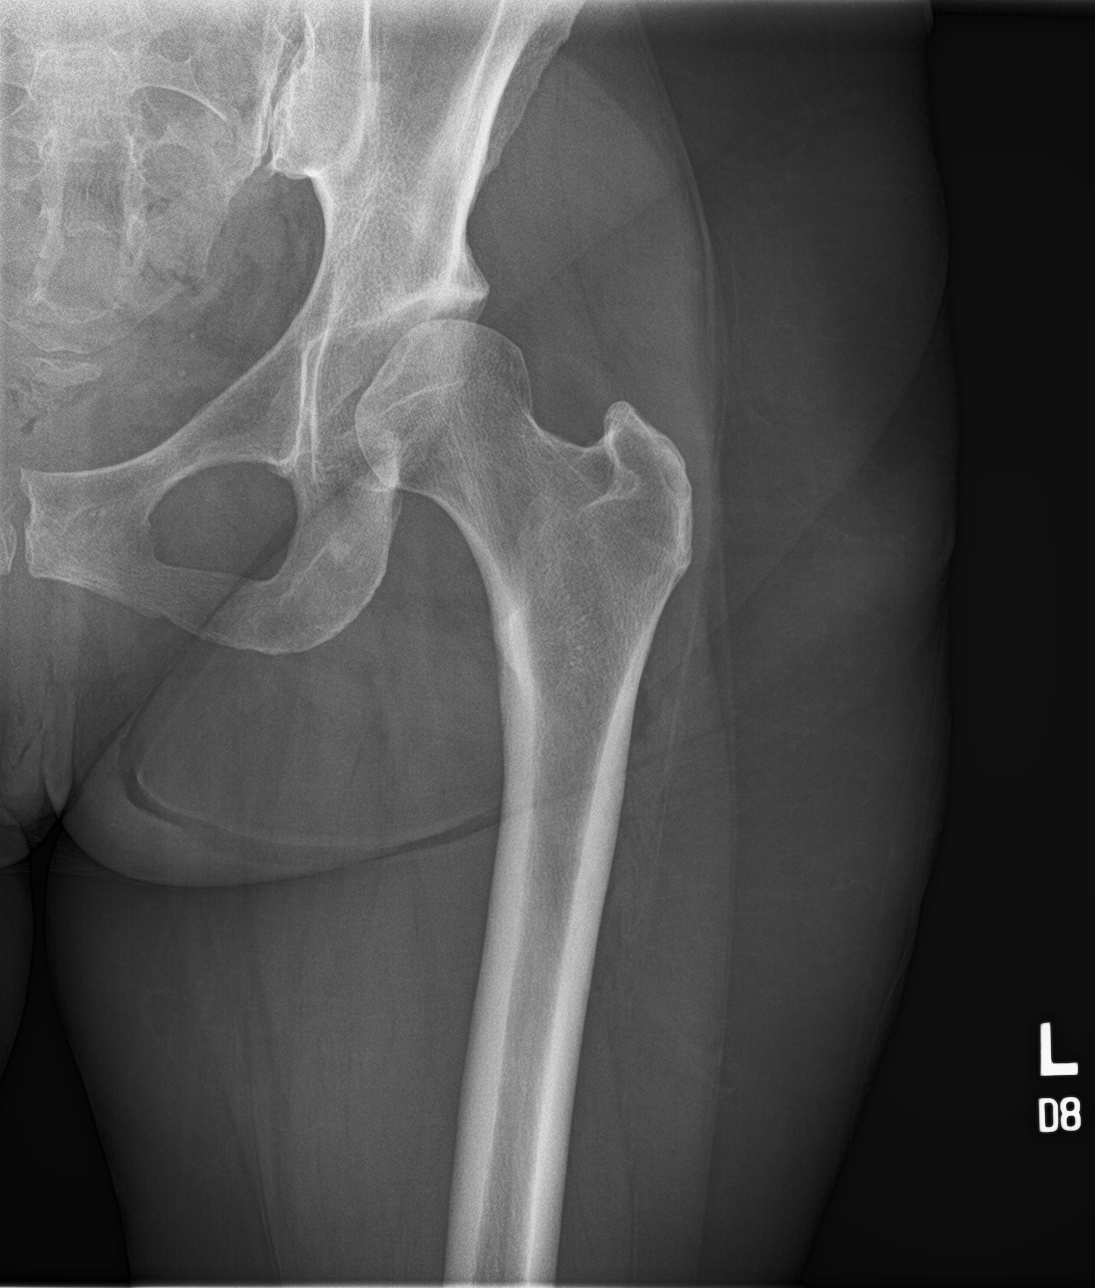
[im 3/3]
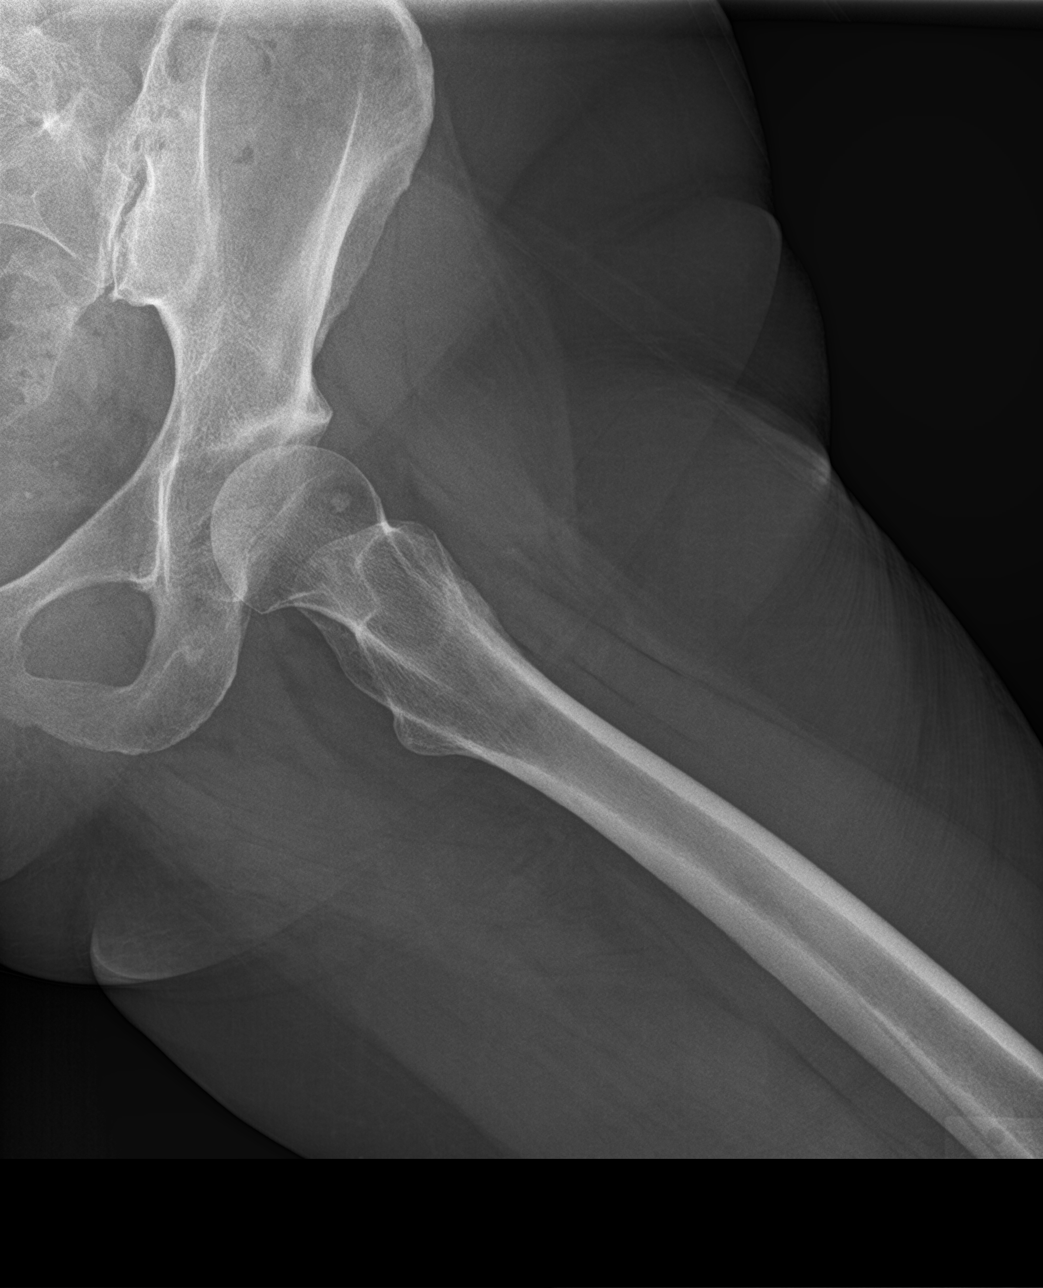

[3 of 3 positions shown; findings below may reference images not displayed]

FINDINGS: Both acetabula are shallow in morphology suggesting dysplasia.
Slight flattening of both medial femoral heads. There is mild
subchondral sclerosis and cystic change of both hips, more prominent
on the right. Left femoral head and inferior ramus bone islands are
incidental. There is no fracture, erosion, evidence of a vascular
necrosis or bony destruction. The pubic symphysis and sacroiliac
joints are congruent. Suspected transitional lumbosacral anatomy.
IMPRESSION: 1. Shallow morphology of both acetabula suggesting congenital
acetabular dysplasia.
2. Minor bilateral hip osteoarthritis, more prominent on the right.

## 2022-07-06 ENCOUNTER — Encounter: Payer: Self-pay | Admitting: Family Medicine

## 2022-07-06 ENCOUNTER — Ambulatory Visit: Payer: Commercial Managed Care - PPO | Admitting: Family Medicine

## 2022-07-06 VITALS — BP 116/74 | HR 69 | Temp 98.7°F | Ht 63.0 in | Wt 146.0 lb

## 2022-07-06 DIAGNOSIS — E559 Vitamin D deficiency, unspecified: Secondary | ICD-10-CM

## 2022-07-06 DIAGNOSIS — D3610 Benign neoplasm of peripheral nerves and autonomic nervous system, unspecified: Secondary | ICD-10-CM | POA: Diagnosis not present

## 2022-07-06 DIAGNOSIS — E538 Deficiency of other specified B group vitamins: Secondary | ICD-10-CM

## 2022-07-06 DIAGNOSIS — R87610 Atypical squamous cells of undetermined significance on cytologic smear of cervix (ASC-US): Secondary | ICD-10-CM

## 2022-07-06 DIAGNOSIS — Z1231 Encounter for screening mammogram for malignant neoplasm of breast: Secondary | ICD-10-CM

## 2022-07-06 DIAGNOSIS — Z13220 Encounter for screening for lipoid disorders: Secondary | ICD-10-CM

## 2022-07-06 DIAGNOSIS — F419 Anxiety disorder, unspecified: Secondary | ICD-10-CM

## 2022-07-06 DIAGNOSIS — E663 Overweight: Secondary | ICD-10-CM

## 2022-07-06 NOTE — Assessment & Plan Note (Signed)
Chronic.  Not currently on supplementation. Repeat levels at next visit

## 2022-07-06 NOTE — Assessment & Plan Note (Signed)
Not currently on vitamin B12 supplementation. Repeat levels at next visit

## 2022-07-06 NOTE — Assessment & Plan Note (Signed)
With LDL.  On statin therapy.  ASCVD risk 1.3% mace Repeat lipids at next visit

## 2022-07-06 NOTE — Assessment & Plan Note (Signed)
History of fibrocystic breast.  Patient reports having painful mammograms.  Reports self breast exams. Referral for mammogram today.  Discussed with patient that if painful can have tach stop at any point.

## 2022-07-06 NOTE — Patient Instructions (Addendum)
It was a pleasure meeting you today. Thank you for allowing me to take part in your health care.  Our goals for today as we discussed include:  You have refills at your pharmacy for your Celexa. If you feel that you need to have an increase in the next few weeks please schedule an appointment and we can discuss.  Referral sent for Mammogram. Please call to schedule appointment. [facility] [address] [place], [place] [address] [phone]   Follow-up with OB/GYN for Pap  Follow-up with neurosurgery as scheduled   Schedule appointment for your annual physical in April.  Schedule a fasting lab appointment 1 week prior to visit.   If you have any questions or concerns, please do not hesitate to call the office at [phone].  I look forward to our next visit and until then take care and stay safe.  Regards,   Carollee Leitz, MD   [facility]

## 2022-07-06 NOTE — Assessment & Plan Note (Signed)
Chronic.  Stable.  Denies any SI/HI.  Tolerating medication well. Continue Celexa 20 mg daily Discussed increasing Celexa to 30 mg, plans to monitor for the next few weeks if feels anxiety is worsening will schedule appointment to discuss increasing medication.

## 2022-07-06 NOTE — Assessment & Plan Note (Signed)
Pap due September 2024. Request patient schedule appointment with Dr. Leafy Ro, OB/GYN

## 2022-07-06 NOTE — Progress Notes (Signed)
SUBJECTIVE:   Chief Complaint  Patient presents with   Establish Care    Transfer of Care   HPI Presents to clinic to transfer care.  No acute concerns today  Anxiety. Asymptomatic.  Denies any SI/HI.  Has been taking Celexa 20 mg for years.  Reports has tried multiple medications since age of 41.  Started new job and wondering if needing increase in medication.  Right schwannoma S/p craniotomy 2022.  Follows with Dr. Celene Kras, Neurosurgery at [facility].  Had last MRI November 2023 and scheduled for follow-up in January 2024 with neurosurgery.    PERTINENT PMH / PSH: Anxiety Right acoustic neuroma status postcraniotomy Right H0H secondary to schwannoma Right frontal meningioma less than 1 cm  OBJECTIVE:  BP 116/74   Pulse 69   Temp 98.7 F (37.1 C)   Ht '5\' 3"'$  (1.6 m)   Wt 146 lb (66.2 kg)   LMP  (LMP Unknown)   SpO2 99%   BMI 25.86 kg/m    Physical Exam Vitals reviewed.  Constitutional:      General: She is not in acute distress.    Appearance: She is not ill-appearing.  HENT:     Head: Normocephalic.     Right Ear: Tympanic membrane, ear canal and external ear normal. There is no impacted cerumen.     Left Ear: Tympanic membrane, ear canal and external ear normal. There is no impacted cerumen.     Nose: Nose normal.  Eyes:     Conjunctiva/sclera: Conjunctivae normal.  Neck:     Thyroid: No thyroid mass, thyromegaly or thyroid tenderness.  Cardiovascular:     Rate and Rhythm: Normal rate and regular rhythm.     Heart sounds: Normal heart sounds.  Pulmonary:     Effort: Pulmonary effort is normal.     Breath sounds: Normal breath sounds.  Abdominal:     General: Abdomen is flat. Bowel sounds are normal.     Palpations: Abdomen is soft.  Musculoskeletal:        General: Normal range of motion.     Cervical back: Normal range of motion.  Neurological:     Mental Status: She is alert and oriented to person, place, and time. Mental status is at baseline.   Psychiatric:        Mood and Affect: Mood normal.        Behavior: Behavior normal.        Thought Content: Thought content normal.        Judgment: Judgment normal.     ASSESSMENT/PLAN:  Anxiety Assessment & Plan: Chronic.  Stable.  Denies any SI/HI.  Tolerating medication well. Continue Celexa 20 mg daily Discussed increasing Celexa to 30 mg, plans to monitor for the next few weeks if feels anxiety is worsening will schedule appointment to discuss increasing medication.    Schwannoma Assessment & Plan: Chronic.  Asymptomatic.  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  Follows with Dr.Zamoradi for acoustic neuroma and small right frontal meningioma. Follow-up with neurosurgery at [facility] as scheduled.   Breast cancer screening by mammogram Assessment & Plan: History of fibrocystic breast.  Patient reports having painful mammograms.  Reports self breast exams. Referral for mammogram today.  Discussed with patient that if painful can have tach stop at any point.  Orders: -     3D Screening Mammogram, Left and Right; Future  Vitamin D deficiency Assessment & Plan: Chronic.  Not currently on supplementation. Repeat levels at next visit  Orders: -  VITAMIN D 25 Hydroxy (Vit-D Deficiency, Fractures); Future  Overweight (BMI 25.0-29.9) -     Comprehensive metabolic panel; Future -     Hemoglobin A1c; Future -     CBC with Differential/Platelet; Future -     TSH; Future -     Vitamin B12; Future  Lipid screening Assessment & Plan: With LDL.  On statin therapy.  ASCVD risk 1.3% mace Repeat lipids at next visit   Orders: -     Lipid panel; Future  ASCUS of cervix with negative high risk HPV Assessment & Plan: Pap due September 2024. Request patient schedule appointment with Dr. Leafy Ro, OB/GYN   B12 deficiency Assessment & Plan: Not currently on vitamin B12 supplementation. Repeat levels at next visit   HCM Declined lung cancer screening.  Previous tobacco  use 1 pack/day x 35 years.  Quit 2015. Declined shingles vaccine Declined flu vaccine Declined hepatitis C and HIV screening Declined COVID-vaccine. Colonoscopy 4/23.  Notable for tubular adenoma.  Follow-up with Dr. Alice Reichert in 7 years.    PDMP reviewed  Return in about 4 months (around 11/05/2022) for annual visit with fasting labs 1 week prior.  Carollee Leitz, MD

## 2022-07-06 NOTE — Assessment & Plan Note (Signed)
Chronic.  Asymptomatic.  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  Follows with Dr.Zamoradi for acoustic neuroma and small right frontal meningioma. Follow-up with neurosurgery at [facility] as scheduled.

## 2022-10-03 ENCOUNTER — Encounter: Payer: Self-pay | Admitting: Family Medicine

## 2022-10-04 NOTE — Telephone Encounter (Signed)
Pt returned Gae Bon CMA call. Note below was read to pt. I booked her for lab on 4/3 @10 :15AM.

## 2022-10-11 ENCOUNTER — Other Ambulatory Visit: Payer: Commercial Managed Care - PPO

## 2022-10-12 ENCOUNTER — Other Ambulatory Visit (INDEPENDENT_AMBULATORY_CARE_PROVIDER_SITE_OTHER): Payer: Commercial Managed Care - PPO

## 2022-10-12 DIAGNOSIS — E559 Vitamin D deficiency, unspecified: Secondary | ICD-10-CM

## 2022-10-12 DIAGNOSIS — E663 Overweight: Secondary | ICD-10-CM

## 2022-10-12 DIAGNOSIS — Z13220 Encounter for screening for lipoid disorders: Secondary | ICD-10-CM

## 2022-10-12 LAB — CBC WITH DIFFERENTIAL/PLATELET
Basophils Absolute: 0.1 10*3/uL (ref 0.0–0.1)
Basophils Relative: 1.4 % (ref 0.0–3.0)
Eosinophils Absolute: 0.6 10*3/uL (ref 0.0–0.7)
Eosinophils Relative: 11.3 % — ABNORMAL HIGH (ref 0.0–5.0)
HCT: 40.6 % (ref 36.0–46.0)
Hemoglobin: 13.8 g/dL (ref 12.0–15.0)
Lymphocytes Relative: 32.6 % (ref 12.0–46.0)
Lymphs Abs: 1.8 10*3/uL (ref 0.7–4.0)
MCHC: 33.9 g/dL (ref 30.0–36.0)
MCV: 89.6 fl (ref 78.0–100.0)
Monocytes Absolute: 0.6 10*3/uL (ref 0.1–1.0)
Monocytes Relative: 10.6 % (ref 3.0–12.0)
Neutro Abs: 2.5 10*3/uL (ref 1.4–7.7)
Neutrophils Relative %: 44.1 % (ref 43.0–77.0)
Platelets: 334 10*3/uL (ref 150.0–400.0)
RBC: 4.53 Mil/uL (ref 3.87–5.11)
RDW: 13.6 % (ref 11.5–15.5)
WBC: 5.6 10*3/uL (ref 4.0–10.5)

## 2022-10-12 LAB — COMPREHENSIVE METABOLIC PANEL
ALT: 26 U/L (ref 0–35)
AST: 27 U/L (ref 0–37)
Albumin: 4.3 g/dL (ref 3.5–5.2)
Alkaline Phosphatase: 100 U/L (ref 39–117)
BUN: 12 mg/dL (ref 6–23)
CO2: 36 mEq/L — ABNORMAL HIGH (ref 19–32)
Calcium: 9.6 mg/dL (ref 8.4–10.5)
Chloride: 99 mEq/L (ref 96–112)
Creatinine, Ser: 0.84 mg/dL (ref 0.40–1.20)
GFR: 78.27 mL/min (ref >60.00)
Glucose, Bld: 73 mg/dL (ref 70–99)
Potassium: 3.8 mEq/L (ref 3.5–5.1)
Sodium: 141 mEq/L (ref 135–145)
Total Bilirubin: 0.4 mg/dL (ref 0.2–1.2)
Total Protein: 6.6 g/dL (ref 6.0–8.3)

## 2022-10-12 LAB — LIPID PANEL
Cholesterol: 232 mg/dL — ABNORMAL HIGH (ref 0–200)
HDL: 81.2 mg/dL (ref >39.00)
LDL Cholesterol: 129 mg/dL — ABNORMAL HIGH (ref 0–99)
NonHDL: 150.92
Total CHOL/HDL Ratio: 3
Triglycerides: 108 mg/dL (ref 0.0–149.0)
VLDL: 21.6 mg/dL (ref 0.0–40.0)

## 2022-10-12 LAB — HEMOGLOBIN A1C: Hgb A1c MFr Bld: 5.3 % (ref 4.6–6.5)

## 2022-10-12 LAB — VITAMIN D 25 HYDROXY (VIT D DEFICIENCY, FRACTURES): VITD: 78.38 ng/mL (ref 30.00–100.00)

## 2022-10-12 LAB — TSH: TSH: 2.01 u[IU]/mL (ref 0.35–5.50)

## 2022-10-12 LAB — VITAMIN B12: Vitamin B-12: 1256 pg/mL — ABNORMAL HIGH (ref 211–911)

## 2022-10-18 ENCOUNTER — Encounter: Payer: Self-pay | Admitting: Family Medicine

## 2022-10-18 ENCOUNTER — Ambulatory Visit (INDEPENDENT_AMBULATORY_CARE_PROVIDER_SITE_OTHER): Payer: Commercial Managed Care - PPO | Admitting: Family Medicine

## 2022-10-18 VITALS — BP 118/70 | HR 95 | Temp 97.7°F | Ht 63.0 in | Wt 143.4 lb

## 2022-10-18 DIAGNOSIS — E538 Deficiency of other specified B group vitamins: Secondary | ICD-10-CM

## 2022-10-18 DIAGNOSIS — F32A Depression, unspecified: Secondary | ICD-10-CM | POA: Insufficient documentation

## 2022-10-18 DIAGNOSIS — E663 Overweight: Secondary | ICD-10-CM

## 2022-10-18 DIAGNOSIS — Z136 Encounter for screening for cardiovascular disorders: Secondary | ICD-10-CM | POA: Diagnosis not present

## 2022-10-18 DIAGNOSIS — F419 Anxiety disorder, unspecified: Secondary | ICD-10-CM

## 2022-10-18 DIAGNOSIS — Z0000 Encounter for general adult medical examination without abnormal findings: Secondary | ICD-10-CM

## 2022-10-18 DIAGNOSIS — Z13220 Encounter for screening for lipoid disorders: Secondary | ICD-10-CM

## 2022-10-18 DIAGNOSIS — R87610 Atypical squamous cells of undetermined significance on cytologic smear of cervix (ASC-US): Secondary | ICD-10-CM

## 2022-10-18 DIAGNOSIS — F339 Major depressive disorder, recurrent, unspecified: Secondary | ICD-10-CM

## 2022-10-18 DIAGNOSIS — Q6589 Other specified congenital deformities of hip: Secondary | ICD-10-CM

## 2022-10-18 DIAGNOSIS — E559 Vitamin D deficiency, unspecified: Secondary | ICD-10-CM | POA: Diagnosis not present

## 2022-10-18 DIAGNOSIS — F17200 Nicotine dependence, unspecified, uncomplicated: Secondary | ICD-10-CM | POA: Insufficient documentation

## 2022-10-18 MED ORDER — CITALOPRAM HYDROBROMIDE 20 MG PO TABS: 20.0000 mg | ORAL_TABLET | Freq: Every day | ORAL | 3 refills | Status: DC

## 2022-10-18 NOTE — Progress Notes (Signed)
SUBJECTIVE:   Chief Complaint  Patient presents with   Annual Exam    Weight loss medication/ hip issues   HPI Patient presents to clinic for annual physical.  No acute concerns today.  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  Mood disorder Requesting refill for SSRI.  Doing well on Celexa 20 mg daily.  Denies SI/HI.  Hyperlipidemia Recent LDL and total cholesterol increased.  No indication for statin therapy.  Discussed diet and exercise.  Also discussed calcium score, patient agreeable and would like to move forward with imaging.  Previous mammogram 09/2018.  Referral sent 12/23.  Patient aware to call and schedule appointment.  Lung cancer screening declined.  35-pack year.  Quit 2015.  Last documented Pap 04/08/20.  ASCUS, HPV negative.  Had Pap smear 02/23 at [facility] clinic.  NILM, HPV negative.  Declined shingles vaccine, flu vaccine, COVID booster  Declined hepatitis C and HIV screening  Previous colonoscopy 4/23 notable for tubular adenoma.  Follows with Dr. Norma Fredrickson.  Recommend repeat in 7 years.  Due for/2030   PERTINENT PMH / PSH: Migraines Schwannoma Congenital hip dysplasia Fibroadenoma Anxiety   OBJECTIVE:  BP 118/70   Pulse 95   Temp 97.7 F (36.5 C) (Oral)   Ht 5\' 3"  (1.6 m)   Wt 143 lb 6.4 oz (65 kg)   LMP  (LMP Unknown)   SpO2 97%   BMI 25.40 kg/m    Physical Exam Vitals reviewed.  Constitutional:      General: She is not in acute distress.    Appearance: Normal appearance. She is not ill-appearing.  HENT:     Head: Normocephalic.     Right Ear: Tympanic membrane, ear canal and external ear normal.     Left Ear: Tympanic membrane, ear canal and external ear normal.     Nose: Nose normal.     Mouth/Throat:     Mouth: Mucous membranes are moist.  Eyes:     Extraocular Movements: Extraocular movements intact.     Conjunctiva/sclera: Conjunctivae normal.     Pupils: Pupils are equal, round, and reactive to light.   Neck:     Vascular: No carotid bruit.  Cardiovascular:     Rate and Rhythm: Normal rate and regular rhythm.     Pulses: Normal pulses.     Heart sounds: Normal heart sounds.  Pulmonary:     Effort: Pulmonary effort is normal.     Breath sounds: Normal breath sounds.  Abdominal:     General: Bowel sounds are normal. There is no distension.     Palpations: Abdomen is soft.     Tenderness: There is no abdominal tenderness. There is no right CVA tenderness, left CVA tenderness, guarding or rebound.  Musculoskeletal:        General: Normal range of motion.     Cervical back: Normal range of motion.     Right lower leg: No edema.     Left lower leg: No edema.  Lymphadenopathy:     Cervical: No cervical adenopathy.  Skin:    Capillary Refill: Capillary refill takes less than 2 seconds.  Neurological:     General: No focal deficit present.     Mental Status: She is alert and oriented to person, place, and time. Mental status is at baseline.     Motor: No weakness.  Psychiatric:        Mood and Affect: Mood normal.        Behavior: Behavior normal.  Thought Content: Thought content normal.        Judgment: Judgment normal.       10/18/2022    8:10 AM 07/06/2022   10:04 AM 08/30/2021    1:02 PM 05/05/2021   10:39 AM 10/07/2019    9:39 AM  Depression screen PHQ 2/9  Decreased Interest 0 0 0 0 0  Down, Depressed, Hopeless 0 0 0 0 0  PHQ - 2 Score 0 0 0 0 0  Altered sleeping  0 0 0   Tired, decreased energy  1 0 0   Change in appetite  0 0 0   Feeling bad or failure about yourself   0 0 0   Trouble concentrating  0 0 0   Moving slowly or fidgety/restless  0 0 0   Suicidal thoughts  0 0 0   PHQ-9 Score  1 0 0   Difficult doing work/chores  Not difficult at all Not difficult at all Not difficult at all        10/18/2022    8:10 AM 07/06/2022   10:05 AM 10/07/2019    9:39 AM  GAD 7 : Generalized Anxiety Score  Nervous, Anxious, on Edge 0 0 0  Control/stop worrying 0 1  0  Worry too much - different things 0 0 0  Trouble relaxing 0 0 0  Restless 0 0 0  Easily annoyed or irritable 0 0 0  Afraid - awful might happen 0 0 0  Total GAD 7 Score 0 1 0  Anxiety Difficulty Not difficult at all Not difficult at all Not difficult at all      ASSESSMENT/PLAN:  Annual physical exam Assessment & Plan: Lipid screening Patient to call to schedule upcoming mammogram Colonoscopy up-to-date Declined LDCT chest Pap up-to-date, follows with OB/GYN Declined shingles, flu and COVID Declined hepatitis C and HIV screening Lipid screening today Normotensive Follow-up in 1 year      Ischemic heart disease screen -     CT CARDIAC SCORING (SELF PAY ONLY); Future  Vitamin D deficiency -     VITAMIN D 25 Hydroxy (Vit-D Deficiency, Fractures); Future  Overweight (BMI 25.0-29.9) -     Hemoglobin A1c; Future -     CBC with Differential/Platelet; Future -     Comprehensive metabolic panel; Future  Lipid screening -     Lipid panel; Future  B12 deficiency -     Vitamin B12; Future  Anxiety, mild Assessment & Plan: Chronic.  Stable.  Denies any SI/HI.  Tolerating medication well. Refill Celexa 20 mg daily Encourage CBT Follow-up as needed   Orders: -     Citalopram Hydrobromide; Take 1 tablet (20 mg total) by mouth daily.  Dispense: 90 tablet; Refill: 3  ASCUS of cervix with negative high risk HPV Assessment & Plan: Last Pap 04/08/2020.  ASCUS, HPV negative Repeat Pap 02/23, NILM, HPV negative Follows with Dr. Dalbert Garnet at [place] [facility]     PDMP reviewed  Return for annual visit with fasting labs 1 week prior.  Dana Allan, MD

## 2022-10-18 NOTE — Patient Instructions (Addendum)
It was a pleasure meeting you today. Thank you for allowing me to take part in your health care.  Our goals for today as we discussed include:   Recommend Shingles vaccine.  This is a 2 dose series and can be given at your local pharmacy.  Please talk to your pharmacist about this.   Referral sent for Mammogram. Please call to schedule appointment. [facility] [address] [place], [place] [address] [phone]   CT scan of heart ordered. They will call you with appointment,  Follow up as needed  Follow up in 1 year for next annual Schedule fasting lab appointment 1 week prior to office visit  Please arrive 15 minutes prior to your appointment.  Arrivals 5 minutes past your appointment time will need to be rescheduled.  This is to ensure that all patients are seen in a timely manner.  Thank you for your understanding and cooperation.     If you have any questions or concerns, please do not hesitate to call the office at [phone].  I look forward to our next visit and until then take care and stay safe.  Regards,   Dana Allan, MD   [facility]

## 2022-10-22 ENCOUNTER — Encounter: Payer: Self-pay | Admitting: Family Medicine

## 2022-10-22 DIAGNOSIS — Z136 Encounter for screening for cardiovascular disorders: Secondary | ICD-10-CM | POA: Insufficient documentation

## 2022-10-22 NOTE — Assessment & Plan Note (Signed)
Lipid screening Patient to call to schedule upcoming mammogram Colonoscopy up-to-date Declined LDCT chest Pap up-to-date, follows with OB/GYN Declined shingles, flu and COVID Declined hepatitis C and HIV screening Lipid screening today Normotensive Follow-up in 1 year

## 2022-10-22 NOTE — Assessment & Plan Note (Signed)
Last Pap 04/08/2020.  ASCUS, HPV negative Repeat Pap 02/23, NILM, HPV negative Follows with Dr. Dalbert Garnet at [place] [facility]

## 2022-10-22 NOTE — Assessment & Plan Note (Signed)
Chronic.  Stable.  Denies any SI/HI.  Tolerating medication well. Refill Celexa 20 mg daily Encourage CBT Follow-up as needed

## 2022-10-27 ENCOUNTER — Other Ambulatory Visit: Payer: Commercial Managed Care - PPO

## 2022-10-30 ENCOUNTER — Other Ambulatory Visit: Payer: Commercial Managed Care - PPO

## 2022-11-06 ENCOUNTER — Encounter: Payer: Commercial Managed Care - PPO | Admitting: Family Medicine

## 2023-01-16 ENCOUNTER — Telehealth: Payer: Self-pay | Admitting: Family Medicine

## 2023-01-16 DIAGNOSIS — F419 Anxiety disorder, unspecified: Secondary | ICD-10-CM

## 2023-01-16 MED ORDER — CITALOPRAM HYDROBROMIDE 20 MG PO TABS
20.0000 mg | ORAL_TABLET | Freq: Every day | ORAL | 0 refills | Status: DC
Start: 2023-01-16 — End: 2023-09-26

## 2023-01-16 NOTE — Telephone Encounter (Signed)
Per Dr. Lorin Picket ok to refill medication. Medication refill sent into preferred pharmacy in [place].  Attempted to call pt but was unable to connect with her. Tried to call three times.  Know that she is in the hospital with a family member so she must have poor cell phone service.  Phone call to Kathlene November, spouse I let him know that the refill was sent in.

## 2023-01-16 NOTE — Telephone Encounter (Signed)
Pt called stating she had to go to [place] because her mom is in the hospital and she forgot her citalopram. Pt want to see if she can get 10 pills [facility] [address] [place] [phone]

## 2023-01-17 NOTE — Telephone Encounter (Signed)
Pt called the pharmacy after 2 and they still do not have the medication

## 2023-01-17 NOTE — Telephone Encounter (Signed)
Left voicemail to notify pt that I called the pharmacy as requested. They did not need me to call it in. They stated that "it should be here after 2:00 today"  I advised pt to check with pharmacy after 2pm.

## 2023-01-17 NOTE — Telephone Encounter (Signed)
I called the pharmacy & spoke with Lowella Bandy and she took a verbal order from me. Pt has been notified as well.

## 2023-01-17 NOTE — Telephone Encounter (Signed)
Pt called stating she called the pharmacy and they stated they do not have anything

## 2023-01-17 NOTE — Telephone Encounter (Signed)
Patient states the [facility] in [place] has not received her prescription for citalopram (CELEXA) 20 MG tablet.  Patient states she spoke with a pharmacist there today and he said sometimes the prescriptions get hung up or there is a glitch, so he asked that we please call it in.  Patient states she would really like to have this medication today because if she goes another day without it she will probably get sick.  Patient states the phone number for the pharmacy is [phone].  Patient states she would like to know which last name we are calling it in under because her name has changed.  Patient states her previous name Carolyn Stare and her new name is Scientist, research (life sciences).  Patient states either is fine, she just needs to know which one.

## 2023-04-20 ENCOUNTER — Encounter: Payer: Self-pay | Admitting: Family Medicine

## 2023-04-20 NOTE — Addendum Note (Signed)
Addended by: Vertis Kelch on: 04/20/2023 03:49 PM   Modules accepted: Orders

## 2023-09-16 ENCOUNTER — Encounter: Payer: Self-pay | Admitting: Family Medicine

## 2023-09-21 ENCOUNTER — Ambulatory Visit
Admission: RE | Admit: 2023-09-21 | Discharge: 2023-09-21 | Disposition: A | Discharge status: Home / Self Care | Source: Ambulatory Visit | Attending: Pulmonary Disease | Admitting: Pulmonary Disease

## 2023-09-21 ENCOUNTER — Encounter: Payer: Self-pay | Admitting: Family Medicine

## 2023-09-21 ENCOUNTER — Ambulatory Visit (INDEPENDENT_AMBULATORY_CARE_PROVIDER_SITE_OTHER): Admitting: Family Medicine

## 2023-09-21 ENCOUNTER — Ambulatory Visit
Admission: RE | Admit: 2023-09-21 | Discharge: 2023-09-21 | Disposition: A | Discharge status: Home / Self Care | Source: Ambulatory Visit | Attending: Family Medicine | Admitting: Family Medicine

## 2023-09-21 VITALS — BP 104/60 | HR 67 | Temp 97.7°F | Resp 20 | Ht 63.0 in | Wt 142.2 lb

## 2023-09-21 DIAGNOSIS — F39 Unspecified mood [affective] disorder: Secondary | ICD-10-CM | POA: Diagnosis not present

## 2023-09-21 DIAGNOSIS — F419 Anxiety disorder, unspecified: Secondary | ICD-10-CM

## 2023-09-21 DIAGNOSIS — M25551 Pain in right hip: Secondary | ICD-10-CM

## 2023-09-21 DIAGNOSIS — Q6589 Other specified congenital deformities of hip: Secondary | ICD-10-CM | POA: Diagnosis not present

## 2023-09-21 DIAGNOSIS — M545 Low back pain, unspecified: Secondary | ICD-10-CM | POA: Diagnosis not present

## 2023-09-21 DIAGNOSIS — G8929 Other chronic pain: Secondary | ICD-10-CM

## 2023-09-21 NOTE — Progress Notes (Signed)
 SUBJECTIVE:   Chief Complaint  Patient presents with   Leg Pain    Both but right is worse. From going from sitting to standing right hip locks up   HPI Presents for acute visit  Discussed the use of AI scribe software for clinical note transcription with the patient, who gave verbal consent to proceed.  History of Present Illness   Margaret Blake is a 58 year old female with congenital hip dysplasia and mild osteoarthritis who presents with worsening hip pain and locking.  She experiences worsening pain and locking in her right hip, which is severe and impacts her daily activities. The pain is particularly pronounced when transitioning from sitting to standing, often causing her to nearly fall due to the hip locking. This issue has been ongoing and has worsened over time, prompting her to change jobs to reduce time spent on her feet.  She recalls an x-ray from 2023 that showed mild osteoarthritis and congenital hip dysplasia. Despite attending physical therapy, she continues to experience significant difficulty with activities such as putting on shoes and pants, and getting up from the floor.  The pain is described as deep in the front of her thighs and becomes constant after prolonged standing, radiating down her legs. No numbness or tingling in her legs, but she reports lower back pain that intensifies with prolonged standing, such as when cooking or doing puzzles.  She manages her pain with ibuprofen, which she finds effective, and avoids narcotics due to adverse effects.  No bowel or bladder issues, fevers, or weakness in her legs, although she feels her right leg is weaker than the left. She experiences no pain when moving her legs inward or when stretching them straight, but notes difficulty when trying to rise from a crouched position due to the hip locking.      PERTINENT PMH / PSH: As above  OBJECTIVE:  BP 104/60   Pulse 67   Temp 97.7 F (36.5 C)   Resp 20   Ht 5'  3" (1.6 m)   Wt 142 lb 4 oz (64.5 kg)   LMP  (LMP Unknown)   SpO2 96%   BMI 25.20 kg/m    Physical Exam Vitals reviewed.  Constitutional:      General: She is not in acute distress.    Appearance: Normal appearance. She is normal weight. She is not ill-appearing, toxic-appearing or diaphoretic.  Eyes:     General:        Right eye: No discharge.        Left eye: No discharge.     Conjunctiva/sclera: Conjunctivae normal.  Cardiovascular:     Rate and Rhythm: Normal rate and regular rhythm.     Heart sounds: Normal heart sounds.  Pulmonary:     Effort: Pulmonary effort is normal.     Breath sounds: Normal breath sounds.  Abdominal:     General: Bowel sounds are normal.  Musculoskeletal:     Right hip: Tenderness present. No bony tenderness or crepitus. Decreased range of motion. Normal strength.     Left hip: Normal.     Right upper leg: Normal.     Left upper leg: Normal.  Skin:    General: Skin is warm and dry.  Neurological:     General: No focal deficit present.     Mental Status: She is alert and oriented to person, place, and time. Mental status is at baseline.  Psychiatric:  Mood and Affect: Mood normal.        Behavior: Behavior normal.        Thought Content: Thought content normal.        Judgment: Judgment normal.           09/21/2023    9:38 AM 10/18/2022    8:10 AM 07/06/2022   10:04 AM 08/30/2021    1:02 PM 05/05/2021   10:39 AM  Depression screen PHQ 2/9  Decreased Interest 0 0 0 0 0  Down, Depressed, Hopeless 0 0 0 0 0  PHQ - 2 Score 0 0 0 0 0  Altered sleeping 0  0 0 0  Tired, decreased energy 0  1 0 0  Change in appetite 0  0 0 0  Feeling bad or failure about yourself  0  0 0 0  Trouble concentrating 0  0 0 0  Moving slowly or fidgety/restless 0  0 0 0  Suicidal thoughts 0  0 0 0  PHQ-9 Score 0  1 0 0  Difficult doing work/chores Not difficult at all  Not difficult at all Not difficult at all Not difficult at all      09/21/2023     9:38 AM 10/18/2022    8:10 AM 07/06/2022   10:05 AM 10/07/2019    9:39 AM  GAD 7 : Generalized Anxiety Score  Nervous, Anxious, on Edge 0 0 0 0  Control/stop worrying 0 0 1 0  Worry too much - different things 0 0 0 0  Trouble relaxing 0 0 0 0  Restless 0 0 0 0  Easily annoyed or irritable 0 0 0 0  Afraid - awful might happen 0 0 0 0  Total GAD 7 Score 0 0 1 0  Anxiety Difficulty Not difficult at all Not difficult at all Not difficult at all Not difficult at all    ASSESSMENT/PLAN:  Right hip pain Assessment & Plan: Worsening right hip pain with locking and difficulty transitioning from sitting to standing. Pain deep in front thighs, exacerbated by prolonged standing. Mild osteoarthritis and congenital hip dysplasia diagnosed in 2023. Suspected progression of osteoarthritis or dysplasia. - Order right hip x-ray. - Consider referral to orthopedics based on x-ray results. - Continue ibuprofen for pain management.  Orders: -     DG HIP UNILAT W OR W/O PELVIS 2-3 VIEWS RIGHT  Mood disorder (HCC) Assessment & Plan: Managed on current SSRI Refill Celexa 20 mg daily  Orders: -     Citalopram Hydrobromide; Take 1 tablet (20 mg total) by mouth daily.  Dispense: 90 tablet; Refill: 3  Congenital hip dysplasia Assessment & Plan: Noted on previous hip xray 08/2021 -Repeat imaging given worsening pain -Recommend referral to Orthopedics    Chronic bilateral low back pain without sciatica Assessment & Plan: Lower back pain with prolonged standing, requiring ibuprofen for relief. No bowel or bladder dysfunction, non-radiating, indicating musculoskeletal origin. - Continue ibuprofen for pain management      PDMP reviewed  Return in about 1 month (around 10/23/2023) for PCP.  Dana Allan, MD

## 2023-09-21 NOTE — Patient Instructions (Signed)
 It was a pleasure meeting you today. Thank you for allowing me to take part in your health care.  Our goals for today as we discussed include:  Go to [facility] entrance for xray of right hip [address]  Continue Ibuprofen 400 mg as needed for pain Heat/Ice as needed    This is a list of the screening recommended for you and due dates:  Health Maintenance  Topic Date Due   Pneumococcal Vaccination (1 of 2 - PCV) Never done   HIV Screening  Never done   Hepatitis C Screening  Never done   Zoster (Shingles) Vaccine (1 of 2) Never done   Screening for Lung Cancer  Never done   Mammogram  09/24/2019   COVID-19 Vaccine (3 - Moderna risk series) 10/31/2019   Flu Shot  02/08/2023   Pap with HPV screening  04/08/2025   DTaP/Tdap/Td vaccine (3 - Td or Tdap) 05/06/2031   Colon Cancer Screening  10/25/2031   HPV Vaccine  Aged Out      If you have any questions or concerns, please do not hesitate to call the office at [phone].  I look forward to our next visit and until then take care and stay safe.  Regards,   Dana Allan, MD   [facility]

## 2023-09-25 ENCOUNTER — Encounter: Payer: Self-pay | Admitting: Family Medicine

## 2023-09-26 ENCOUNTER — Encounter: Payer: Self-pay | Admitting: Family Medicine

## 2023-09-26 DIAGNOSIS — Q6589 Other specified congenital deformities of hip: Secondary | ICD-10-CM | POA: Insufficient documentation

## 2023-09-26 DIAGNOSIS — M545 Low back pain, unspecified: Secondary | ICD-10-CM | POA: Insufficient documentation

## 2023-09-26 MED ORDER — CITALOPRAM HYDROBROMIDE 20 MG PO TABS: 20.0000 mg | ORAL_TABLET | Freq: Every day | ORAL | 3 refills | Status: DC

## 2023-09-26 NOTE — Assessment & Plan Note (Signed)
 Managed on current SSRI Refill Celexa 20 mg daily

## 2023-09-26 NOTE — Assessment & Plan Note (Signed)
 Worsening right hip pain with locking and difficulty transitioning from sitting to standing. Pain deep in front thighs, exacerbated by prolonged standing. Mild osteoarthritis and congenital hip dysplasia diagnosed in 2023. Suspected progression of osteoarthritis or dysplasia. - Order right hip x-ray. - Consider referral to orthopedics based on x-ray results. - Continue ibuprofen for pain management.

## 2023-09-26 NOTE — Assessment & Plan Note (Signed)
 Lower back pain with prolonged standing, requiring ibuprofen for relief. No bowel or bladder dysfunction, non-radiating, indicating musculoskeletal origin. - Continue ibuprofen for pain management

## 2023-09-26 NOTE — Assessment & Plan Note (Signed)
 Noted on previous hip xray 08/2021 -Repeat imaging given worsening pain -Recommend referral to Orthopedics

## 2023-10-04 ENCOUNTER — Other Ambulatory Visit: Payer: Self-pay | Admitting: Family Medicine

## 2023-10-04 DIAGNOSIS — M545 Low back pain, unspecified: Secondary | ICD-10-CM

## 2023-10-12 DIAGNOSIS — M25551 Pain in right hip: Secondary | ICD-10-CM | POA: Diagnosis not present

## 2023-10-16 ENCOUNTER — Other Ambulatory Visit (INDEPENDENT_AMBULATORY_CARE_PROVIDER_SITE_OTHER): Payer: Commercial Managed Care - PPO

## 2023-10-16 DIAGNOSIS — Z13220 Encounter for screening for lipoid disorders: Secondary | ICD-10-CM | POA: Diagnosis not present

## 2023-10-16 DIAGNOSIS — E538 Deficiency of other specified B group vitamins: Secondary | ICD-10-CM

## 2023-10-16 DIAGNOSIS — E559 Vitamin D deficiency, unspecified: Secondary | ICD-10-CM | POA: Diagnosis not present

## 2023-10-16 DIAGNOSIS — E663 Overweight: Secondary | ICD-10-CM

## 2023-10-16 LAB — CBC WITH DIFFERENTIAL/PLATELET
Basophils Absolute: 0.1 10*3/uL (ref 0.0–0.1)
Basophils Relative: 1.3 % (ref 0.0–3.0)
Eosinophils Absolute: 0.5 10*3/uL (ref 0.0–0.7)
Eosinophils Relative: 9.9 % — ABNORMAL HIGH (ref 0.0–5.0)
HCT: 42.1 % (ref 36.0–46.0)
Hemoglobin: 14 g/dL (ref 12.0–15.0)
Lymphocytes Relative: 35.5 % (ref 12.0–46.0)
Lymphs Abs: 2 10*3/uL (ref 0.7–4.0)
MCHC: 33.3 g/dL (ref 30.0–36.0)
MCV: 90.3 fl (ref 78.0–100.0)
Monocytes Absolute: 0.5 10*3/uL (ref 0.1–1.0)
Monocytes Relative: 8.9 % (ref 3.0–12.0)
Neutro Abs: 2.5 10*3/uL (ref 1.4–7.7)
Neutrophils Relative %: 44.4 % (ref 43.0–77.0)
Platelets: 353 10*3/uL (ref 150.0–400.0)
RBC: 4.66 Mil/uL (ref 3.87–5.11)
RDW: 14.2 % (ref 11.5–15.5)
WBC: 5.5 10*3/uL (ref 4.0–10.5)

## 2023-10-16 LAB — COMPREHENSIVE METABOLIC PANEL WITH GFR
ALT: 17 U/L (ref 0–35)
AST: 19 U/L (ref 0–37)
Albumin: 4.6 g/dL (ref 3.5–5.2)
Alkaline Phosphatase: 96 U/L (ref 39–117)
BUN: 11 mg/dL (ref 6–23)
CO2: 33 meq/L — ABNORMAL HIGH (ref 19–32)
Calcium: 9.8 mg/dL (ref 8.4–10.5)
Chloride: 99 meq/L (ref 96–112)
Creatinine, Ser: 0.82 mg/dL (ref 0.40–1.20)
GFR: 80 mL/min (ref >60.00)
Glucose, Bld: 75 mg/dL (ref 70–99)
Potassium: 3.8 meq/L (ref 3.5–5.1)
Sodium: 143 meq/L (ref 135–145)
Total Bilirubin: 0.5 mg/dL (ref 0.2–1.2)
Total Protein: 6.7 g/dL (ref 6.0–8.3)

## 2023-10-16 LAB — LIPID PANEL
Cholesterol: 242 mg/dL — ABNORMAL HIGH (ref 0–200)
HDL: 81.1 mg/dL (ref >39.00)
LDL Cholesterol: 135 mg/dL — ABNORMAL HIGH (ref 0–99)
NonHDL: 160.68
Total CHOL/HDL Ratio: 3
Triglycerides: 129 mg/dL (ref 0.0–149.0)
VLDL: 25.8 mg/dL (ref 0.0–40.0)

## 2023-10-16 LAB — VITAMIN B12: Vitamin B-12: 517 pg/mL (ref 211–911)

## 2023-10-16 LAB — VITAMIN D 25 HYDROXY (VIT D DEFICIENCY, FRACTURES): VITD: 57.45 ng/mL (ref 30.00–100.00)

## 2023-10-16 LAB — HEMOGLOBIN A1C: Hgb A1c MFr Bld: 5.3 % (ref 4.6–6.5)

## 2023-10-17 DIAGNOSIS — M25551 Pain in right hip: Secondary | ICD-10-CM | POA: Diagnosis not present

## 2023-10-17 DIAGNOSIS — M25552 Pain in left hip: Secondary | ICD-10-CM | POA: Diagnosis not present

## 2023-10-23 ENCOUNTER — Encounter: Payer: Commercial Managed Care - PPO | Admitting: Family Medicine

## 2023-11-02 ENCOUNTER — Ambulatory Visit (INDEPENDENT_AMBULATORY_CARE_PROVIDER_SITE_OTHER): Admitting: Family Medicine

## 2023-11-02 VITALS — BP 110/70 | HR 79 | Temp 98.3°F | Resp 17 | Ht 62.5 in | Wt 146.4 lb

## 2023-11-02 DIAGNOSIS — F39 Unspecified mood [affective] disorder: Secondary | ICD-10-CM | POA: Diagnosis not present

## 2023-11-02 DIAGNOSIS — Z13220 Encounter for screening for lipoid disorders: Secondary | ICD-10-CM

## 2023-11-02 DIAGNOSIS — Z1231 Encounter for screening mammogram for malignant neoplasm of breast: Secondary | ICD-10-CM | POA: Diagnosis not present

## 2023-11-02 DIAGNOSIS — Q6589 Other specified congenital deformities of hip: Secondary | ICD-10-CM

## 2023-11-02 DIAGNOSIS — D249 Benign neoplasm of unspecified breast: Secondary | ICD-10-CM | POA: Diagnosis not present

## 2023-11-02 DIAGNOSIS — Z0000 Encounter for general adult medical examination without abnormal findings: Secondary | ICD-10-CM

## 2023-11-02 NOTE — Progress Notes (Signed)
 SUBJECTIVE:   Chief Complaint  Patient presents with   Annual Exam    CPE   HPI Presents for annual physical  Discussed the use of AI scribe software for clinical note transcription with the patient, who gave verbal consent to proceed.  History of Present Illness Margaret Blake is a 58 year old female who presents for an annual physical exam.  She does not undergo mammograms due to significant pain from her fibrocystic breast condition, lasting three to six months post-procedure. She performs regular self-exams and has not noticed any changes or concerns. A biopsy was performed approximately 20 years ago, which was the last time she had a mammogram.  She has a history of congenital hip dysplasia, which has progressively worsened. She experiences pain and occasional catching when transitioning from sitting to standing or after prolonged standing, though it does not significantly impede her daily activities. She has undergone x-rays and consulted with a hip specialist, who confirmed the worsening of the dysplasia.  She quit smoking in 2015 and has noted weight gain, which she attributes to dietary habits. Her cholesterol levels have increased, but she is not on medication. She is currently taking Celexa  and requires a refill. Her vitamin D  levels are stable with daily supplementation.  No chest pain or shortness of breath.   PERTINENT PMH / PSH: As above  OBJECTIVE:  BP 110/70 (Cuff Size: Normal)   Pulse 79   Temp 98.3 F (36.8 C) (Oral)   Resp 17   Ht 5' 2.5" (1.588 m)   Wt 146 lb 6 oz (66.4 kg)   LMP  (LMP Unknown)   SpO2 96%   BMI 26.35 kg/m    Physical Exam Vitals reviewed.  Constitutional:      General: She is not in acute distress.    Appearance: She is not ill-appearing.  HENT:     Head: Normocephalic.     Right Ear: Tympanic membrane, ear canal and external ear normal.     Left Ear: Tympanic membrane, ear canal and external ear normal.     Nose: Nose  normal.     Mouth/Throat:     Mouth: Mucous membranes are moist.  Eyes:     Extraocular Movements: Extraocular movements intact.     Conjunctiva/sclera: Conjunctivae normal.     Pupils: Pupils are equal, round, and reactive to light.  Neck:     Thyroid : No thyromegaly or thyroid  tenderness.     Vascular: No carotid bruit.  Cardiovascular:     Rate and Rhythm: Normal rate and regular rhythm.     Pulses: Normal pulses.     Heart sounds: Normal heart sounds.  Pulmonary:     Effort: Pulmonary effort is normal.     Breath sounds: Normal breath sounds.  Abdominal:     General: Bowel sounds are normal. There is no distension.     Palpations: Abdomen is soft.     Tenderness: There is no abdominal tenderness. There is no right CVA tenderness, left CVA tenderness, guarding or rebound.  Musculoskeletal:        General: Normal range of motion.     Cervical back: Normal range of motion.     Right lower leg: No edema.     Left lower leg: No edema.  Lymphadenopathy:     Cervical: No cervical adenopathy.  Skin:    Capillary Refill: Capillary refill takes less than 2 seconds.  Neurological:     General: No focal deficit present.  Mental Status: She is alert and oriented to person, place, and time. Mental status is at baseline.     Motor: No weakness.  Psychiatric:        Mood and Affect: Mood normal.        Behavior: Behavior normal.        Thought Content: Thought content normal.        Judgment: Judgment normal.           11/02/2023    8:31 AM 09/21/2023    9:38 AM 10/18/2022    8:10 AM 07/06/2022   10:04 AM 08/30/2021    1:02 PM  Depression screen PHQ 2/9  Decreased Interest 0 0 0 0 0  Down, Depressed, Hopeless 0 0 0 0 0  PHQ - 2 Score 0 0 0 0 0  Altered sleeping 0 0  0 0  Tired, decreased energy 0 0  1 0  Change in appetite 0 0  0 0  Feeling bad or failure about yourself  0 0  0 0  Trouble concentrating 0 0  0 0  Moving slowly or fidgety/restless 0 0  0 0  Suicidal  thoughts 0 0  0 0  PHQ-9 Score 0 0  1 0  Difficult doing work/chores Not difficult at all Not difficult at all  Not difficult at all Not difficult at all      11/02/2023    8:31 AM 09/21/2023    9:38 AM 10/18/2022    8:10 AM 07/06/2022   10:05 AM  GAD 7 : Generalized Anxiety Score  Nervous, Anxious, on Edge 0 0 0 0  Control/stop worrying 0 0 0 1  Worry too much - different things 0 0 0 0  Trouble relaxing 0 0 0 0  Restless 0 0 0 0  Easily annoyed or irritable 0 0 0 0  Afraid - awful might happen 0 0 0 0  Total GAD 7 Score 0 0 0 1  Anxiety Difficulty Not difficult at all Not difficult at all Not difficult at all Not difficult at all    ASSESSMENT/PLAN:  Annual physical exam Assessment & Plan: Routine wellness visit conducted. Discussed breast self-exams and mammogram avoidance due to fibrocystic breast changes causing significant pain. Reviewed colonoscopy and pap smear schedules. Blood pressure and vitamin D  levels are within normal limits. Previous hepatitis C and HIV screenings were discussed, but results are not available for review. Patient declines mammogram.   Colonoscopy up-to-date Declined LDCT chest Pap up-to-date, follows with OB/GYN Declined shingles, flu and COVID     Breast cancer screening by mammogram  Mood disorder (HCC) Assessment & Plan: Managed on current SSRI Continue Celexa  20 mg daily   Fibroadenoma of breast, unspecified laterality Assessment & Plan: Experiences significant pain following mammograms due to fibrocystic breast changes, leading to avoidance of mammograms. Willing to undergo ultrasound if available. Ultrasound may be considered as an alternative to mammogram if approved by the breast center.   Congenital hip dysplasia Assessment & Plan: Congenital hip dysplasia is worsening but not significantly impacting daily activities. Recent x-rays reviewed by a specialist, no MRI required at this time. Pain is constant but manageable. Follow up  with Ortho   Lipid screening Assessment & Plan: Cholesterol levels have increased. ASCVD risk calculated at 1.3% Discussed dietary modifications to manage cholesterol levels.      PDMP reviewed  Return if symptoms worsen or fail to improve, for PCP.  Valli Gaw, MD

## 2023-11-02 NOTE — Patient Instructions (Addendum)
 It was a pleasure meeting you today. Thank you for allowing me to take part in your health care.  Our goals for today as we discussed include:  You have a year of Celexa  sent to pharmacy  Will check to see if can get breast ultrasound.     This is a list of the screening recommended for you and due dates:  Health Maintenance  Topic Date Due   HIV Screening  Never done   Hepatitis C Screening  Never done   Pneumococcal Vaccination (1 of 2 - PCV) Never done   Zoster (Shingles) Vaccine (1 of 2) Never done   Screening for Lung Cancer  Never done   Mammogram  09/24/2019   COVID-19 Vaccine (3 - Moderna risk series) 10/31/2019   Flu Shot  02/08/2024   Pap with HPV screening  04/08/2025   DTaP/Tdap/Td vaccine (3 - Td or Tdap) 05/06/2031   Colon Cancer Screening  10/25/2031   HPV Vaccine  Aged Out   Meningitis B Vaccine  Aged Out     If you have any questions or concerns, please do not hesitate to call the office at [phone].  I look forward to our next visit and until then take care and stay safe.  Regards,   Valli Gaw, MD   [facility]

## 2023-11-11 ENCOUNTER — Encounter: Payer: Self-pay | Admitting: Family Medicine

## 2023-11-11 NOTE — Assessment & Plan Note (Signed)
 Routine wellness visit conducted. Discussed breast self-exams and mammogram avoidance due to fibrocystic breast changes causing significant pain. Reviewed colonoscopy and pap smear schedules. Blood pressure and vitamin D  levels are within normal limits. Previous hepatitis C and HIV screenings were discussed, but results are not available for review. Patient declines mammogram.   Colonoscopy up-to-date Declined LDCT chest Pap up-to-date, follows with OB/GYN Declined shingles, flu and COVID

## 2023-11-11 NOTE — Assessment & Plan Note (Signed)
 Cholesterol levels have increased. ASCVD risk calculated at 1.3% Discussed dietary modifications to manage cholesterol levels.

## 2023-11-11 NOTE — Assessment & Plan Note (Signed)
 Managed on current SSRI Continue Celexa  20 mg daily

## 2023-11-11 NOTE — Assessment & Plan Note (Signed)
 Experiences significant pain following mammograms due to fibrocystic breast changes, leading to avoidance of mammograms. Willing to undergo ultrasound if available. Ultrasound may be considered as an alternative to mammogram if approved by the breast center.

## 2023-11-11 NOTE — Assessment & Plan Note (Signed)
 Congenital hip dysplasia is worsening but not significantly impacting daily activities. Recent x-rays reviewed by a specialist, no MRI required at this time. Pain is constant but manageable. Follow up with Ortho

## 2023-11-17 ENCOUNTER — Other Ambulatory Visit: Payer: Self-pay | Admitting: Family Medicine

## 2023-11-17 DIAGNOSIS — F39 Unspecified mood [affective] disorder: Secondary | ICD-10-CM

## 2024-01-16 DIAGNOSIS — M25552 Pain in left hip: Secondary | ICD-10-CM | POA: Diagnosis not present

## 2024-01-16 DIAGNOSIS — M25551 Pain in right hip: Secondary | ICD-10-CM | POA: Diagnosis not present

## 2024-01-16 DIAGNOSIS — M162 Bilateral osteoarthritis resulting from hip dysplasia: Secondary | ICD-10-CM | POA: Diagnosis not present

## 2024-02-04 ENCOUNTER — Telehealth: Payer: Self-pay

## 2024-02-04 DIAGNOSIS — F39 Unspecified mood [affective] disorder: Secondary | ICD-10-CM

## 2024-02-04 NOTE — Telephone Encounter (Signed)
 Copied from CRM [id number]. Topic: Appointments - Appointment Cancel/Reschedule >> Feb 04, 2024  4:09 PM Margaret Blake wrote: Patient/patient representative is calling to cancel or reschedule an appointment.  Rescheduled due to death in family and would like medication refill in between when needed

## 2024-02-06 MED ORDER — CITALOPRAM HYDROBROMIDE 20 MG PO TABS
20.0000 mg | ORAL_TABLET | Freq: Every day | ORAL | 0 refills | Status: DC
Start: 2024-02-06 — End: 2024-05-16

## 2024-02-06 NOTE — Addendum Note (Signed)
 Addended by: ORLANDO KINGDOM on: 02/06/2024 08:32 AM   Modules accepted: Orders

## 2024-02-08 ENCOUNTER — Encounter: Admitting: Nurse Practitioner

## 2024-05-16 ENCOUNTER — Encounter: Payer: Self-pay | Admitting: Nurse Practitioner

## 2024-05-16 ENCOUNTER — Ambulatory Visit: Admitting: Nurse Practitioner

## 2024-05-16 VITALS — BP 108/70 | HR 72 | Temp 98.0°F | Ht 62.5 in | Wt 148.6 lb

## 2024-05-16 DIAGNOSIS — E559 Vitamin D deficiency, unspecified: Secondary | ICD-10-CM

## 2024-05-16 DIAGNOSIS — D333 Benign neoplasm of cranial nerves: Secondary | ICD-10-CM

## 2024-05-16 DIAGNOSIS — F39 Unspecified mood [affective] disorder: Secondary | ICD-10-CM | POA: Diagnosis not present

## 2024-05-16 DIAGNOSIS — M162 Bilateral osteoarthritis resulting from hip dysplasia: Secondary | ICD-10-CM

## 2024-05-16 DIAGNOSIS — D329 Benign neoplasm of meninges, unspecified: Secondary | ICD-10-CM | POA: Insufficient documentation

## 2024-05-16 MED ORDER — CITALOPRAM HYDROBROMIDE 20 MG PO TABS: 20.0000 mg | ORAL_TABLET | Freq: Every day | ORAL | 3 refills | Status: AC

## 2024-05-16 NOTE — Progress Notes (Signed)
 Leron Glance, NP-C Phone: [phone]  Margaret Blake is a 58 y.o. female who presents today for transfer of care.   Discussed the use of AI scribe software for clinical note transcription with the patient, who gave verbal consent to proceed.  History of Present Illness   Margaret Blake is a 58 year old female with hip dysplasia and arthritis who presents for transfer of care.  She has been experiencing worsening hip pain over the last few weeks. She recalls an incident over the summer in [place]  where she nearly fell in a grocery store, suspecting a piece of cartilage might have been involved. Her hip dysplasia and arthritis in both hips are exacerbated by weight gain associated with menopause.  She has a history of meningioma and vestibular schwannoma, with one tumor located in the front and one in the back. In November 2022, she underwent surgery to remove approximately 97% of the schwannoma, as further attempts risked affecting her facial nerves. 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  She takes Celexa  for anxiety, which she has been on since she was 58 years old. Missing doses for three to four days results in nausea and withdrawal symptoms. She also takes a vitamin D  supplement.  She quit smoking in 2015 after smoking for 35 years, which has improved her breathing.      Social History   Tobacco Use  Smoking Status Former  Smokeless Tobacco Never    Current Outpatient Medications on File Prior to Visit  Medication Sig Dispense Refill   cholecalciferol (VITAMIN D3) 25 MCG (1000 UNIT) tablet Take 1,000 Units by mouth daily.     hydrocortisone  2.5 % cream Apply topically 2 (two) times daily. Prn face and right upper arm 60 g 11   No current facility-administered medications on file prior to visit.     ROS see history of present illness  Objective  Physical Exam Vitals:   05/16/24 1348  BP: 108/70  Pulse: 72  Temp: 98 F (36.7 C)  SpO2: 97%    BP  Readings from Last 3 Encounters:  05/16/24 108/70  11/02/23 110/70  09/21/23 104/60   Wt Readings from Last 3 Encounters:  05/16/24 148 lb 9.6 oz (67.4 kg)  11/02/23 146 lb 6 oz (66.4 kg)  09/21/23 142 lb 4 oz (64.5 kg)    Physical Exam Constitutional:      General: She is not in acute distress.    Appearance: Normal appearance.  HENT:     Head: Normocephalic.  Cardiovascular:     Rate and Rhythm: Normal rate and regular rhythm.     Heart sounds: Normal heart sounds.  Pulmonary:     Effort: Pulmonary effort is normal.     Breath sounds: Normal breath sounds.  Skin:    General: Skin is warm and dry.  Neurological:     General: No focal deficit present.     Mental Status: She is alert.  Psychiatric:        Mood and Affect: Mood normal.        Behavior: Behavior normal.      Assessment/Plan: Please see individual problem list.  Mood disorder Assessment & Plan: Managed with Celexa , effectively controls symptoms. Withdrawal symptoms if missed for more than a few days. Continue Celexa  20 mg daily. Refills sent. Encouraged to contact if worsening symptoms, unusual behavior changes or suicidal thoughts occur.   Orders: -     Citalopram  Hydrobromide; Take 1 tablet (  20 mg total) by mouth daily.  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 Weight gain may contribute to symptoms. Continue follow-up with orthopedic specialist, Dr. Richie, at [facility].   Meningioma [facility]) Assessment & Plan: Benign. Continue with scheduled MRI in February. Follow up with Neurology.    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 Follow up with Neuro as scheduled.    Vitamin D  deficiency Assessment & Plan: Vitamin D  deficiency managed  with supplementation. Continue vitamin D  supplementation.       Return in about 6 months (around 11/04/2024) for Annual Exam, sooner as needed.   Leron Glance, NP-C [facility]

## 2024-05-16 NOTE — Assessment & Plan Note (Signed)
 Worsening bilateral hip pain likely due to osteoarthritis secondary to hip dysplasia. Weight gain may contribute to symptoms. Continue follow-up with orthopedic specialist, Dr. Richie, at [facility]

## 2024-05-16 NOTE — Assessment & Plan Note (Signed)
 Benign. Continue with scheduled MRI in February. Follow up with Neurology.

## 2024-05-16 NOTE — Assessment & Plan Note (Signed)
 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 Follow up with Neuro as scheduled.

## 2024-05-16 NOTE — Assessment & Plan Note (Signed)
 Managed with Celexa , effectively controls symptoms. Withdrawal symptoms if missed for more than a few days. Continue Celexa  20 mg daily. Refills sent. Encouraged to contact if worsening symptoms, unusual behavior changes or suicidal thoughts occur.

## 2024-05-16 NOTE — Assessment & Plan Note (Signed)
 Vitamin D deficiency managed with supplementation. - Continue vitamin D supplementation.

## 2024-06-25 DIAGNOSIS — M25551 Pain in right hip: Secondary | ICD-10-CM | POA: Diagnosis not present

## 2024-06-25 DIAGNOSIS — M162 Bilateral osteoarthritis resulting from hip dysplasia: Secondary | ICD-10-CM | POA: Diagnosis not present
